# Patient Record
Sex: Male | Born: 1970 | Race: White | Hispanic: No | Marital: Married | Smoking: Heavy tobacco smoker
Health system: Southern US, Community
[De-identification: ages and names within clinical notes are randomized; demographics above are authoritative.]

## PROBLEM LIST (undated history)

## (undated) DIAGNOSIS — J45909 Unspecified asthma, uncomplicated: Secondary | ICD-10-CM

## (undated) HISTORY — PX: HAND SURGERY: SHX662

---

## 2017-01-27 ENCOUNTER — Observation Stay: Payer: Self-pay

## 2017-01-27 ENCOUNTER — Encounter: Payer: Self-pay | Admitting: Emergency Medicine

## 2017-01-27 ENCOUNTER — Observation Stay: Payer: Self-pay | Admitting: Anesthesiology

## 2017-01-27 ENCOUNTER — Emergency Department: Payer: Self-pay

## 2017-01-27 ENCOUNTER — Other Ambulatory Visit: Payer: Self-pay

## 2017-01-27 ENCOUNTER — Observation Stay
Admission: EM | Admit: 2017-01-27 | Discharge: 2017-01-28 | Disposition: A | Discharge status: Home / Self Care | Payer: Self-pay | Attending: Surgery | Admitting: Surgery

## 2017-01-27 ENCOUNTER — Encounter
Admission: EM | Disposition: A | Discharge status: Home / Self Care | Payer: Self-pay | Source: Home / Self Care | Attending: Emergency Medicine

## 2017-01-27 DIAGNOSIS — W010XXA Fall on same level from slipping, tripping and stumbling without subsequent striking against object, initial encounter: Secondary | ICD-10-CM | POA: Insufficient documentation

## 2017-01-27 DIAGNOSIS — M25571 Pain in right ankle and joints of right foot: Secondary | ICD-10-CM | POA: Insufficient documentation

## 2017-01-27 DIAGNOSIS — S82851A Displaced trimalleolar fracture of right lower leg, initial encounter for closed fracture: Secondary | ICD-10-CM

## 2017-01-27 DIAGNOSIS — S82841A Displaced bimalleolar fracture of right lower leg, initial encounter for closed fracture: Principal | ICD-10-CM | POA: Insufficient documentation

## 2017-01-27 DIAGNOSIS — S0181XA Laceration without foreign body of other part of head, initial encounter: Secondary | ICD-10-CM | POA: Insufficient documentation

## 2017-01-27 DIAGNOSIS — Z419 Encounter for procedure for purposes other than remedying health state, unspecified: Secondary | ICD-10-CM

## 2017-01-27 DIAGNOSIS — Y92481 Parking lot as the place of occurrence of the external cause: Secondary | ICD-10-CM | POA: Insufficient documentation

## 2017-01-27 DIAGNOSIS — F17200 Nicotine dependence, unspecified, uncomplicated: Secondary | ICD-10-CM | POA: Insufficient documentation

## 2017-01-27 HISTORY — PX: ORIF ANKLE FRACTURE: SHX5408

## 2017-01-27 LAB — COMPREHENSIVE METABOLIC PANEL
ALBUMIN: 4.1 g/dL (ref 3.5–5.0)
ALT: 43 U/L (ref 17–63)
ANION GAP: 8 (ref 5–15)
AST: 37 U/L (ref 15–41)
Alkaline Phosphatase: 91 U/L (ref 38–126)
BILIRUBIN TOTAL: 0.5 mg/dL (ref 0.3–1.2)
BUN: 14 mg/dL (ref 6–20)
CHLORIDE: 105 mmol/L (ref 101–111)
CO2: 25 mmol/L (ref 22–32)
Calcium: 9.1 mg/dL (ref 8.9–10.3)
Creatinine, Ser: 1.16 mg/dL (ref 0.61–1.24)
GFR calc Af Amer: >60 mL/min (ref >60)
GLUCOSE: 111 mg/dL — AB (ref 65–99)
POTASSIUM: 3.6 mmol/L (ref 3.5–5.1)
Sodium: 138 mmol/L (ref 135–145)
TOTAL PROTEIN: 7.4 g/dL (ref 6.5–8.1)

## 2017-01-27 LAB — CBC
HEMATOCRIT: 44 % (ref 40.0–52.0)
Hemoglobin: 15.3 g/dL (ref 13.0–18.0)
MCH: 33.4 pg (ref 26.0–34.0)
MCHC: 34.8 g/dL (ref 32.0–36.0)
MCV: 95.9 fL (ref 80.0–100.0)
PLATELETS: 254 10*3/uL (ref 150–440)
RBC: 4.59 MIL/uL (ref 4.40–5.90)
RDW: 13.8 % (ref 11.5–14.5)
WBC: 8.8 10*3/uL (ref 3.8–10.6)

## 2017-01-27 LAB — SURGICAL PCR SCREEN
MRSA, PCR: NEGATIVE
STAPHYLOCOCCUS AUREUS: NEGATIVE

## 2017-01-27 LAB — ETHANOL: ALCOHOL ETHYL (B): 257 mg/dL — AB (ref <10)

## 2017-01-27 SURGERY — OPEN REDUCTION INTERNAL FIXATION (ORIF) ANKLE FRACTURE
Anesthesia: General | Site: Ankle | Laterality: Right | Wound class: Clean

## 2017-01-27 MED ORDER — DEXTROSE 5 % IV SOLN
2.0000 g | Freq: Four times a day (QID) | INTRAVENOUS | Status: AC
  Administered 2017-01-27 – 2017-01-28 (×3): 2 g via INTRAVENOUS
  Filled 2017-01-27 (×3): qty 2000

## 2017-01-27 MED ORDER — FENTANYL CITRATE (PF) 100 MCG/2ML IJ SOLN
INTRAMUSCULAR | Status: AC
  Administered 2017-01-27: 50 ug via INTRAVENOUS
  Filled 2017-01-27: qty 2

## 2017-01-27 MED ORDER — ONDANSETRON HCL 4 MG/2ML IJ SOLN: 4.0000 mg | Freq: Four times a day (QID) | INTRAMUSCULAR | Status: DC | PRN

## 2017-01-27 MED ORDER — MAGNESIUM HYDROXIDE 400 MG/5ML PO SUSP: 30.0000 mL | Freq: Every day | ORAL | Status: DC | PRN

## 2017-01-27 MED ORDER — MORPHINE SULFATE (PF) 4 MG/ML IV SOLN
4.0000 mg | Freq: Once | INTRAVENOUS | Status: AC
  Administered 2017-01-27: 4 mg via INTRAVENOUS
  Filled 2017-01-27: qty 1

## 2017-01-27 MED ORDER — PANTOPRAZOLE SODIUM 40 MG PO TBEC
40.0000 mg | DELAYED_RELEASE_TABLET | Freq: Every day | ORAL | Status: DC
  Administered 2017-01-28: 40 mg via ORAL
  Filled 2017-01-27: qty 1

## 2017-01-27 MED ORDER — DEXAMETHASONE SODIUM PHOSPHATE 10 MG/ML IJ SOLN
INTRAMUSCULAR | Status: AC
Start: 2017-01-27 — End: ?
  Filled 2017-01-27: qty 1

## 2017-01-27 MED ORDER — PHENYLEPHRINE HCL 10 MG/ML IJ SOLN
INTRAMUSCULAR | Status: AC
  Filled 2017-01-27: qty 1

## 2017-01-27 MED ORDER — SUCCINYLCHOLINE CHLORIDE 20 MG/ML IJ SOLN
INTRAMUSCULAR | Status: AC
  Filled 2017-01-27: qty 1

## 2017-01-27 MED ORDER — BISACODYL 10 MG RE SUPP: 10.0000 mg | Freq: Every day | RECTAL | Status: DC | PRN

## 2017-01-27 MED ORDER — NEOMYCIN-POLYMYXIN B GU 40-200000 IR SOLN
Status: DC | PRN
  Administered 2017-01-27: 2 mL

## 2017-01-27 MED ORDER — ACETAMINOPHEN 650 MG RE SUPP: 650.0000 mg | Freq: Four times a day (QID) | RECTAL | Status: DC | PRN

## 2017-01-27 MED ORDER — ACETAMINOPHEN 650 MG RE SUPP: 650.0000 mg | RECTAL | Status: DC | PRN

## 2017-01-27 MED ORDER — LIDOCAINE HCL (CARDIAC) 20 MG/ML IV SOLN
INTRAVENOUS | Status: DC | PRN
  Administered 2017-01-27: 100 mg via INTRAVENOUS

## 2017-01-27 MED ORDER — ACETAMINOPHEN 10 MG/ML IV SOLN
INTRAVENOUS | Status: DC | PRN
  Administered 2017-01-27: 1000 mg via INTRAVENOUS

## 2017-01-27 MED ORDER — MORPHINE SULFATE (PF) 2 MG/ML IV SOLN
2.0000 mg | Freq: Once | INTRAVENOUS | Status: AC
  Administered 2017-01-27: 2 mg via INTRAVENOUS
  Filled 2017-01-27: qty 1

## 2017-01-27 MED ORDER — ACETAMINOPHEN 500 MG PO TABS
1000.0000 mg | ORAL_TABLET | Freq: Four times a day (QID) | ORAL | Status: AC
  Administered 2017-01-27 – 2017-01-28 (×4): 1000 mg via ORAL
  Filled 2017-01-27 (×4): qty 2

## 2017-01-27 MED ORDER — MIDAZOLAM HCL 2 MG/2ML IJ SOLN
INTRAMUSCULAR | Status: DC | PRN
  Administered 2017-01-27 (×2): 2 mg via INTRAVENOUS

## 2017-01-27 MED ORDER — DEXAMETHASONE SODIUM PHOSPHATE 10 MG/ML IJ SOLN
INTRAMUSCULAR | Status: DC | PRN
  Administered 2017-01-27: 5 mg via INTRAVENOUS

## 2017-01-27 MED ORDER — DEXTROSE 5 % IV SOLN
2.0000 g | Freq: Once | INTRAVENOUS | Status: AC
  Administered 2017-01-27: 2 g via INTRAVENOUS
  Filled 2017-01-27: qty 2000

## 2017-01-27 MED ORDER — ONDANSETRON HCL 4 MG/2ML IJ SOLN
INTRAMUSCULAR | Status: AC
  Filled 2017-01-27: qty 2

## 2017-01-27 MED ORDER — PROMETHAZINE HCL 25 MG/ML IJ SOLN: 6.2500 mg | INTRAMUSCULAR | Status: DC | PRN

## 2017-01-27 MED ORDER — KETOROLAC TROMETHAMINE 15 MG/ML IJ SOLN
30.0000 mg | Freq: Once | INTRAMUSCULAR | Status: DC
  Filled 2017-01-27: qty 2

## 2017-01-27 MED ORDER — OXYCODONE HCL 5 MG PO TABS: 5.0000 mg | ORAL_TABLET | ORAL | Status: DC | PRN

## 2017-01-27 MED ORDER — OXYCODONE HCL 5 MG PO TABS: 5.0000 mg | ORAL_TABLET | Freq: Once | ORAL | Status: DC | PRN

## 2017-01-27 MED ORDER — FENTANYL CITRATE (PF) 100 MCG/2ML IJ SOLN: 25.0000 ug | INTRAMUSCULAR | Status: DC | PRN

## 2017-01-27 MED ORDER — OXYCODONE HCL 5 MG PO TABS
10.0000 mg | ORAL_TABLET | ORAL | Status: DC | PRN
  Administered 2017-01-27 – 2017-01-28 (×2): 10 mg via ORAL
  Filled 2017-01-27 (×2): qty 2

## 2017-01-27 MED ORDER — KCL IN DEXTROSE-NACL 20-5-0.9 MEQ/L-%-% IV SOLN
INTRAVENOUS | Status: DC
  Administered 2017-01-27: 08:00:00 via INTRAVENOUS
  Filled 2017-01-27 (×3): qty 1000

## 2017-01-27 MED ORDER — ACETAMINOPHEN 10 MG/ML IV SOLN
INTRAVENOUS | Status: AC
  Filled 2017-01-27: qty 100

## 2017-01-27 MED ORDER — LIDOCAINE HCL (PF) 1 % IJ SOLN
INTRAMUSCULAR | Status: DC | PRN
  Administered 2017-01-27: 3 mL

## 2017-01-27 MED ORDER — MIDAZOLAM HCL 2 MG/2ML IJ SOLN
INTRAMUSCULAR | Status: AC
  Administered 2017-01-27: 2 mg via INTRAVENOUS
  Filled 2017-01-27: qty 2

## 2017-01-27 MED ORDER — ACETAMINOPHEN 325 MG PO TABS: 650.0000 mg | ORAL_TABLET | ORAL | Status: DC | PRN

## 2017-01-27 MED ORDER — CEFAZOLIN SODIUM 1 G IJ SOLR
INTRAMUSCULAR | Status: AC
  Filled 2017-01-27: qty 20

## 2017-01-27 MED ORDER — METOCLOPRAMIDE HCL 5 MG/ML IJ SOLN: 5.0000 mg | Freq: Three times a day (TID) | INTRAMUSCULAR | Status: DC | PRN

## 2017-01-27 MED ORDER — CEFAZOLIN SODIUM-DEXTROSE 2-3 GM-%(50ML) IV SOLR
INTRAVENOUS | Status: DC | PRN
  Administered 2017-01-27: 2 g via INTRAVENOUS

## 2017-01-27 MED ORDER — MIDAZOLAM HCL 2 MG/2ML IJ SOLN
2.0000 mg | Freq: Once | INTRAMUSCULAR | Status: AC
  Administered 2017-01-27: 2 mg via INTRAVENOUS

## 2017-01-27 MED ORDER — FENTANYL CITRATE (PF) 100 MCG/2ML IJ SOLN
50.0000 ug | Freq: Once | INTRAMUSCULAR | Status: AC
  Administered 2017-01-27: 50 ug via INTRAVENOUS

## 2017-01-27 MED ORDER — PROPOFOL 10 MG/ML IV BOLUS
INTRAVENOUS | Status: DC | PRN
  Administered 2017-01-27: 140 mg via INTRAVENOUS

## 2017-01-27 MED ORDER — MIDAZOLAM HCL 2 MG/2ML IJ SOLN
INTRAMUSCULAR | Status: AC
  Filled 2017-01-27: qty 2

## 2017-01-27 MED ORDER — LIDOCAINE HCL (PF) 1 % IJ SOLN
INTRAMUSCULAR | Status: AC
  Filled 2017-01-27: qty 5

## 2017-01-27 MED ORDER — DIPHENHYDRAMINE HCL 12.5 MG/5ML PO ELIX: 12.5000 mg | ORAL_SOLUTION | ORAL | Status: DC | PRN

## 2017-01-27 MED ORDER — NICOTINE 21 MG/24HR TD PT24
21.0000 mg | MEDICATED_PATCH | Freq: Every day | TRANSDERMAL | Status: DC
  Administered 2017-01-27: 21 mg via TRANSDERMAL
  Filled 2017-01-27: qty 1

## 2017-01-27 MED ORDER — KETOROLAC TROMETHAMINE 30 MG/ML IJ SOLN
INTRAMUSCULAR | Status: DC | PRN
  Administered 2017-01-27: 30 mg via INTRAVENOUS

## 2017-01-27 MED ORDER — ROPIVACAINE HCL 5 MG/ML IJ SOLN
INTRAMUSCULAR | Status: DC | PRN
  Administered 2017-01-27: 10 mL
  Administered 2017-01-27: 30 mL via PERINEURAL

## 2017-01-27 MED ORDER — ENOXAPARIN SODIUM 40 MG/0.4ML ~~LOC~~ SOLN: 40.0000 mg | SUBCUTANEOUS | Status: DC

## 2017-01-27 MED ORDER — ONDANSETRON HCL 4 MG PO TABS: 4.0000 mg | ORAL_TABLET | Freq: Four times a day (QID) | ORAL | Status: DC | PRN

## 2017-01-27 MED ORDER — HYDROMORPHONE HCL 1 MG/ML IJ SOLN
INTRAMUSCULAR | Status: DC | PRN
  Administered 2017-01-27 (×2): 0.5 mg via INTRAVENOUS

## 2017-01-27 MED ORDER — FLEET ENEMA 7-19 GM/118ML RE ENEM: 1.0000 | ENEMA | Freq: Once | RECTAL | Status: DC | PRN

## 2017-01-27 MED ORDER — FENTANYL CITRATE (PF) 100 MCG/2ML IJ SOLN
INTRAMUSCULAR | Status: DC | PRN
  Administered 2017-01-27 (×2): 50 ug via INTRAVENOUS

## 2017-01-27 MED ORDER — LIDOCAINE HCL (PF) 1 % IJ SOLN
INTRAMUSCULAR | Status: AC
  Administered 2017-01-27: 5 mL
  Filled 2017-01-27: qty 5

## 2017-01-27 MED ORDER — PANTOPRAZOLE SODIUM 40 MG IV SOLR: 40.0000 mg | Freq: Every day | INTRAVENOUS | Status: DC

## 2017-01-27 MED ORDER — ONDANSETRON HCL 4 MG/2ML IJ SOLN
INTRAMUSCULAR | Status: DC | PRN
  Administered 2017-01-27: 4 mg via INTRAVENOUS

## 2017-01-27 MED ORDER — ONDANSETRON 4 MG PO TBDP
4.0000 mg | ORAL_TABLET | Freq: Four times a day (QID) | ORAL | Status: DC | PRN
  Filled 2017-01-27: qty 1

## 2017-01-27 MED ORDER — PROPOFOL 10 MG/ML IV BOLUS
INTRAVENOUS | Status: AC
  Filled 2017-01-27: qty 40

## 2017-01-27 MED ORDER — SUCCINYLCHOLINE CHLORIDE 20 MG/ML IJ SOLN
INTRAMUSCULAR | Status: DC | PRN
  Administered 2017-01-27: 80 mg via INTRAVENOUS

## 2017-01-27 MED ORDER — HYDROMORPHONE HCL 1 MG/ML IJ SOLN
1.0000 mg | Freq: Once | INTRAMUSCULAR | Status: AC
  Administered 2017-01-27: 1 mg via INTRAVENOUS
  Filled 2017-01-27: qty 1

## 2017-01-27 MED ORDER — METOCLOPRAMIDE HCL 10 MG PO TABS: 5.0000 mg | ORAL_TABLET | Freq: Three times a day (TID) | ORAL | Status: DC | PRN

## 2017-01-27 MED ORDER — DOCUSATE SODIUM 100 MG PO CAPS
100.0000 mg | ORAL_CAPSULE | Freq: Two times a day (BID) | ORAL | Status: DC
  Administered 2017-01-27 – 2017-01-28 (×2): 100 mg via ORAL
  Filled 2017-01-27 (×2): qty 1

## 2017-01-27 MED ORDER — OXYCODONE HCL 5 MG/5ML PO SOLN: 5.0000 mg | Freq: Once | ORAL | Status: DC | PRN

## 2017-01-27 MED ORDER — KCL IN DEXTROSE-NACL 20-5-0.9 MEQ/L-%-% IV SOLN
INTRAVENOUS | Status: DC
  Administered 2017-01-27 (×2): via INTRAVENOUS
  Filled 2017-01-27 (×4): qty 1000

## 2017-01-27 MED ORDER — MORPHINE SULFATE (PF) 2 MG/ML IV SOLN
2.0000 mg | INTRAVENOUS | Status: DC | PRN
  Administered 2017-01-28: 4 mg via INTRAVENOUS
  Filled 2017-01-27: qty 2

## 2017-01-27 MED ORDER — CEFAZOLIN SODIUM-DEXTROSE 2-4 GM/100ML-% IV SOLN
2.0000 g | Freq: Once | INTRAVENOUS | Status: DC
Start: 2017-01-27 — End: 2017-01-27

## 2017-01-27 MED ORDER — KETOROLAC TROMETHAMINE 15 MG/ML IJ SOLN
15.0000 mg | Freq: Four times a day (QID) | INTRAMUSCULAR | Status: AC
  Administered 2017-01-27 – 2017-01-28 (×4): 15 mg via INTRAVENOUS
  Filled 2017-01-27 (×4): qty 1

## 2017-01-27 MED ORDER — DOCUSATE SODIUM 100 MG PO CAPS: 100.0000 mg | ORAL_CAPSULE | Freq: Two times a day (BID) | ORAL | Status: DC

## 2017-01-27 MED ORDER — FENTANYL CITRATE (PF) 100 MCG/2ML IJ SOLN
INTRAMUSCULAR | Status: AC
  Filled 2017-01-27: qty 2

## 2017-01-27 MED ORDER — MEPERIDINE HCL 50 MG/ML IJ SOLN: 6.2500 mg | INTRAMUSCULAR | Status: DC | PRN

## 2017-01-27 MED ORDER — MORPHINE SULFATE (PF) 2 MG/ML IV SOLN
2.0000 mg | INTRAVENOUS | Status: DC | PRN
  Administered 2017-01-27 (×2): 4 mg via INTRAVENOUS
  Filled 2017-01-27 (×2): qty 2

## 2017-01-27 MED ORDER — ROPIVACAINE HCL 5 MG/ML IJ SOLN
INTRAMUSCULAR | Status: AC
  Filled 2017-01-27: qty 60

## 2017-01-27 MED ORDER — ACETAMINOPHEN 325 MG PO TABS: 650.0000 mg | ORAL_TABLET | Freq: Four times a day (QID) | ORAL | Status: DC | PRN

## 2017-01-27 MED ORDER — HYDROMORPHONE HCL 1 MG/ML IJ SOLN
INTRAMUSCULAR | Status: AC
  Filled 2017-01-27: qty 1

## 2017-01-27 MED ORDER — LACTATED RINGERS IV SOLN
INTRAVENOUS | Status: DC | PRN
  Administered 2017-01-27: 12:00:00 via INTRAVENOUS

## 2017-01-27 SURGICAL SUPPLY — 70 items
BANDAGE ACE 4X5 VEL STRL LF (GAUZE/BANDAGES/DRESSINGS) ×9 IMPLANT
BANDAGE ACE 6X5 VEL STRL LF (GAUZE/BANDAGES/DRESSINGS) ×6 IMPLANT
BIOMET ZIP TIGHT FIXATION SYSTEM ×3 IMPLANT
BIT DRILL 2.5X2.75 QC CALB (BIT) ×3 IMPLANT
BIT DRILL 2.9 CANN QC NONSTRL (BIT) ×3 IMPLANT
BIT DRILL 3.5X5.5 QC CALB (BIT) ×3 IMPLANT
BIT DRILL CALIBRATED 2.7 (BIT) ×2 IMPLANT
BIT DRILL CALIBRATED 2.7MM (BIT) ×1
BLADE CLIPPER SURG (BLADE) ×3 IMPLANT
BLADE SURG SZ10 CARB STEEL (BLADE) ×6 IMPLANT
BNDG COHESIVE 4X5 TAN STRL (GAUZE/BANDAGES/DRESSINGS) ×3 IMPLANT
BNDG ESMARK 6X12 TAN STRL LF (GAUZE/BANDAGES/DRESSINGS) ×3 IMPLANT
BNDG PLASTER FAST 4X5 WHT LF (CAST SUPPLIES) IMPLANT
CANISTER SUCT 1200ML W/VALVE (MISCELLANEOUS) ×3 IMPLANT
CHLORAPREP W/TINT 26ML (MISCELLANEOUS) ×6 IMPLANT
COVER LIGHT HANDLE STERIS (MISCELLANEOUS) ×6 IMPLANT
CUFF TOURN 24 STER (MISCELLANEOUS) ×3 IMPLANT
CUFF TOURN 30 STER DUAL PORT (MISCELLANEOUS) IMPLANT
DRAPE C-ARM XRAY 36X54 (DRAPES) ×3 IMPLANT
DRAPE C-ARMOR (DRAPES) ×3 IMPLANT
DRAPE INCISE IOBAN 66X45 STRL (DRAPES) ×3 IMPLANT
DRAPE U-SHAPE 47X51 STRL (DRAPES) ×3 IMPLANT
ELECT CAUTERY BLADE 6.4 (BLADE) ×3 IMPLANT
ELECT REM PT RETURN 9FT ADLT (ELECTROSURGICAL) ×3
ELECTRODE REM PT RTRN 9FT ADLT (ELECTROSURGICAL) ×1 IMPLANT
FIXATION ZIPTIGHT ANKLE SNDSMS (Ankle) ×1 IMPLANT
GAUZE PETRO XEROFOAM 1X8 (MISCELLANEOUS) ×3 IMPLANT
GAUZE SPONGE 4X4 12PLY STRL (GAUZE/BANDAGES/DRESSINGS) ×3 IMPLANT
GLOVE BIO SURGEON STRL SZ8 (GLOVE) ×6 IMPLANT
GLOVE INDICATOR 8.0 STRL GRN (GLOVE) ×3 IMPLANT
GOWN STRL REUS W/ TWL LRG LVL3 (GOWN DISPOSABLE) ×1 IMPLANT
GOWN STRL REUS W/ TWL XL LVL3 (GOWN DISPOSABLE) ×1 IMPLANT
GOWN STRL REUS W/TWL LRG LVL3 (GOWN DISPOSABLE) ×2
GOWN STRL REUS W/TWL XL LVL3 (GOWN DISPOSABLE) ×2
HEMOVAC 400ML (MISCELLANEOUS)
K-WIRE ACE 1.6X6 (WIRE) ×6
KIT DRAIN HEMOVAC JP 7FR 400ML (MISCELLANEOUS) IMPLANT
KIT RM TURNOVER STRD PROC AR (KITS) ×3 IMPLANT
KWIRE ACE 1.6X6 (WIRE) ×2 IMPLANT
LABEL OR SOLS (LABEL) ×3 IMPLANT
NS IRRIG 1000ML POUR BTL (IV SOLUTION) ×3 IMPLANT
PACK EXTREMITY ARMC (MISCELLANEOUS) ×3 IMPLANT
PAD ABD DERMACEA PRESS 5X9 (GAUZE/BANDAGES/DRESSINGS) ×6 IMPLANT
PAD CAST CTTN 4X4 STRL (SOFTGOODS) ×2 IMPLANT
PAD PREP 24X41 OB/GYN DISP (PERSONAL CARE ITEMS) ×3 IMPLANT
PADDING CAST 4IN STRL (MISCELLANEOUS) ×4
PADDING CAST BLEND 4X4 STRL (MISCELLANEOUS) ×2 IMPLANT
PADDING CAST COTTON 4X4 STRL (SOFTGOODS) ×4
PLATE LOCK 8H 103 BILAT FIB (Plate) ×3 IMPLANT
SCREW ACE CAN 4.0 40M (Screw) ×6 IMPLANT
SCREW CORTICAL 3.5MM  16MM (Screw) ×2 IMPLANT
SCREW CORTICAL 3.5MM 16MM (Screw) ×1 IMPLANT
SCREW CORTICAL 3.5MM 18MM (Screw) ×3 IMPLANT
SCREW LOCK CORT STAR 3.5X12 (Screw) ×6 IMPLANT
SCREW LOCK CORT STAR 3.5X14 (Screw) ×3 IMPLANT
SCREW NON LOCKING LP 3.5 14MM (Screw) ×6 IMPLANT
SCREW NON LOCKING LP 3.5 16MM (Screw) ×3 IMPLANT
SPLINT CAST 1 STEP 5X30 WHT (MISCELLANEOUS) ×3 IMPLANT
SPLINT FAST PLASTER 5X30 (CAST SUPPLIES) ×2
SPLINT PLASTER CAST FAST 5X30 (CAST SUPPLIES) ×1 IMPLANT
SPONGE LAP 18X18 5 PK (GAUZE/BANDAGES/DRESSINGS) ×3 IMPLANT
STAPLER SKIN PROX 35W (STAPLE) ×3 IMPLANT
STOCKINETTE IMPERV 14X48 (MISCELLANEOUS) ×3 IMPLANT
SUT VIC AB 0 CT1 36 (SUTURE) ×3 IMPLANT
SUT VIC AB 2-0 CT1 27 (SUTURE) ×2
SUT VIC AB 2-0 CT1 TAPERPNT 27 (SUTURE) ×1 IMPLANT
SUT VIC AB 2-0 SH 27 (SUTURE) ×8
SUT VIC AB 2-0 SH 27XBRD (SUTURE) ×4 IMPLANT
SYR 10ML LL (SYRINGE) ×3 IMPLANT
ZIPTIGHT ANKLE SYNODESMOSS FIX (Ankle) ×3 IMPLANT
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
## 2017-01-27 NOTE — Progress Notes (Signed)
Pt arrived to room from PACU. Pt denies any pain at this time. IV infusing. Pt on room air. Call bell within reach. Clear liquid diet started. Call bell and phone within reach.
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
## 2017-01-27 NOTE — Anesthesia Preprocedure Evaluation (Signed)
Anesthesia Evaluation  Patient identified by MRN, date of birth, ID band Patient awake    Reviewed: Allergy & Precautions, NPO status , Patient's Chart, lab work & pertinent test results  History of Anesthesia Complications Negative for: history of anesthetic complications  Airway Mallampati: II  TM Distance: >3 FB Neck ROM: Full    Dental no notable dental hx.    Pulmonary asthma (mild intermittent) , neg sleep apnea, neg COPD, Current Smoker,    breath sounds clear to auscultation- rhonchi (-) wheezing      Cardiovascular Exercise Tolerance: Good (-) hypertension(-) CAD, (-) Past MI and (-) Cardiac Stents  Rhythm:Regular Rate:Normal - Systolic murmurs and - Diastolic murmurs    Neuro/Psych negative neurological ROS  negative psych ROS   GI/Hepatic negative GI ROS, Neg liver ROS,   Endo/Other  negative endocrine ROSneg diabetes  Renal/GU negative Renal ROS     Musculoskeletal negative musculoskeletal ROS (+)   Abdominal (+) - obese,   Peds  Hematology negative hematology ROS (+)   Anesthesia Other Findings History reviewed. No pertinent past medical history.   Reproductive/Obstetrics                             Anesthesia Physical Anesthesia Plan  ASA: II  Anesthesia Plan: General   Post-op Pain Management:  Regional for Post-op pain   Induction: Intravenous  PONV Risk Score and Plan: 0 and Ondansetron  Airway Management Planned: Oral ETT  Additional Equipment:   Intra-op Plan:   Post-operative Plan: Extubation in OR  Informed Consent: I have reviewed the patients History and Physical, chart, labs and discussed the procedure including the risks, benefits and alternatives for the proposed anesthesia with the patient or authorized representative who has indicated his/her understanding and acceptance.   Dental advisory given  Plan Discussed with: CRNA and  Anesthesiologist  Anesthesia Plan Comments:         Anesthesia Quick Evaluation

## 2017-01-27 NOTE — Anesthesia Post-op Follow-up Note (Signed)
Anesthesia QCDR form completed.        
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
## 2017-01-27 NOTE — Anesthesia Procedure Notes (Addendum)
Anesthesia Regional Block: Popliteal block   Pre-Anesthetic Checklist: ,, timeout performed, Correct Patient, Correct Site, Correct Laterality, Correct Procedure, Correct Position, site marked, Risks and benefits discussed,  Surgical consent,  Pre-op evaluation,  At surgeon's request and post-op pain management  Laterality: Right  Prep: chloraprep       Needles:  Injection technique: Single-shot  Needle Type: Stimiplex     Needle Length: 10cm  Needle Gauge: 20     Additional Needles:   Procedures:,,,, ultrasound used (permanent image in chart),,,,  Narrative:  Start time: 01/27/2017 11:35 AM End time: 01/27/2017 11:45 AM Injection made incrementally with aspirations every 5 mL.  Performed by: Personally  Anesthesiologist: Alver FisherPenwarden, Derrill Bagnell, MD  Additional Notes: Functioning IV was confirmed and monitors were applied.  A Stimuplex needle was used. Sterile prep and drape,hand hygiene and sterile gloves were used.  Negative aspiration and negative test dose prior to incremental administration of local anesthetic. The patient tolerated the procedure well.  Supplemental saphenous block was also performed
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
## 2017-01-27 NOTE — Progress Notes (Signed)
Pt alert and oriented. Medicated for pain x1 with good results. Voiding in the urinal without difficulty. Eating and drinking without difficulty. Iv infusing without difficulty. [facility] she will resume care of pt this shift.

## 2017-01-27 NOTE — Anesthesia Procedure Notes (Signed)
Procedure Name: Intubation Date/Time: 01/27/2017 12:44 PM Performed by: Emmie Niemann, MD Pre-anesthesia Checklist: Patient identified, Patient being monitored, Timeout performed, Emergency Drugs available and Suction available Patient Re-evaluated:Patient Re-evaluated prior to induction Oxygen Delivery Method: Circle system utilized Preoxygenation: Pre-oxygenation with 100% oxygen Induction Type: IV induction Ventilation: Mask ventilation without difficulty Laryngoscope Size: Glidescope and 4 Grade View: Grade I Tube type: Oral Tube size: 7.5 mm Number of attempts: 2 (Difficulty passing ETT w/DL MAC 4; Chose Glidescope) Airway Equipment and Method: Stylet and Video-laryngoscopy Placement Confirmation: ETT inserted through vocal cords under direct vision,  positive ETCO2 and breath sounds checked- equal and bilateral Secured at: 22 cm Tube secured with: Tape Dental Injury: Teeth and Oropharynx as per pre-operative assessment

## 2017-01-27 NOTE — Transfer of Care (Signed)
Immediate Anesthesia Transfer of Care Note  Patient: Ronald Logan  Procedure(s) Performed: OPEN REDUCTION INTERNAL FIXATION (ORIF) ANKLE FRACTURE (Right Anus)  Patient Location: PACU  Anesthesia Type:General  Level of Consciousness: sedated and patient cooperative  Airway & Oxygen Therapy: Patient Spontanous Breathing and Patient connected to face mask oxygen  Post-op Assessment: Report given to RN, Post -op Vital signs reviewed and stable and Patient moving all extremities  Post vital signs: Reviewed and stable  Last Vitals:  Vitals:   01/27/17 1222 01/27/17 1508  BP:    Pulse: 86   Resp: 14   Temp:  (P) 36.7 C  SpO2: 98%     Last Pain:  Vitals:   01/27/17 1147  TempSrc:   PainSc: Asleep      Patients Stated Pain Goal: 1 (01/27/17 0903)  Complications: No apparent anesthesia complications
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
## 2017-01-27 NOTE — Anesthesia Postprocedure Evaluation (Signed)
Anesthesia Post Note  Patient: Ronald BuntingMichael Allen Logan  Procedure(s) Performed: OPEN REDUCTION INTERNAL FIXATION (ORIF) ANKLE FRACTURE (Right Ankle)  Anesthesia Type: General     Last Vitals:  Vitals:   01/27/17 1523 01/27/17 1538  BP: 129/90 (!) 132/95  Pulse: 89 88  Resp: (!) 8 (!) 7  Temp:    SpO2: 99% 99%    Last Pain:  Vitals:   01/27/17 1523  TempSrc:   PainSc: Asleep                 Rhyen Mazariego
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
## 2017-01-28 ENCOUNTER — Encounter: Payer: Self-pay | Admitting: Surgery

## 2017-01-28 MED ORDER — OXYCODONE HCL 5 MG PO TABS: 5.0000 mg | ORAL_TABLET | ORAL | 0 refills | Status: AC | PRN
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
## 2017-01-28 NOTE — Care Management (Signed)
[facility] Card given for prescriptions. [facility](MATCH) program
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
## 2017-01-28 NOTE — Progress Notes (Signed)
Discharge note;  Discharge instructions and prescription given to pt. IV removed. Pt getting dressed. Ready to be discharged.

## 2017-01-28 NOTE — Discharge Instructions (Signed)
Diet: As you were doing prior to hospitalization   Shower:  May shower but keep the wounds dry, use an occlusive plastic wrap, NO SOAKING IN TUB.  If the bandage gets wet, change with a clean dry gauze.  Dressing:  You may change your dressing as needed. Change the dressing with sterile gauze dressing.    Activity:  Increase activity slowly as tolerated, but follow the weight bearing instructions below.  No lifting or driving for 6 weeks.  Weight Bearing:   No weightbearing to right lower extremity  Blood Clot Prevention: Take one 325mg  aspirin daily.  To prevent constipation: you may use a stool softener such as -  Colace (over the counter) 100 mg by mouth twice a day  Drink plenty of fluids (prune juice may be helpful) and high fiber foods Miralax (over the counter) for constipation as needed.    Itching:  If you experience itching with your medications, try taking only a single pain pill, or even half a pain pill at a time.  You may take up to 10 pain pills per day, and you can also use benadryl over the counter for itching or also to help with sleep.   Precautions:  If you experience chest pain or shortness of breath - call 911 immediately for transfer to the hospital emergency department!!  If you develop a fever greater that 101 F, purulent drainage from wound, increased redness or drainage from wound, or calf pain-Call [facility]                                              Follow- Up Appointment:  Please call for an appointment [facility]

## 2017-01-28 NOTE — Evaluation (Signed)
Physical Therapy Evaluation Patient Details Name: Ronald Logan MRN: [id number] DOB: 08/01/1970 Today's Date: 01/28/2017   History of Present Illness  Pt is a 47 y/o M who presented s/p fall and R bimalleolar ankle fx.  Pt now s/p ORIF R ankle.  Clinical Impression  Patient is s/p above surgery resulting in functional limitations due to the deficits listed below (see PT Problem List). 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  This PT provided supervision for transfers and ambulation for safety.  Pt demonstrates safe technique using RW with all aspects of mobility and was able to maintain NWB RLE without cues. Provided information via handout and explanation on [facility] once MD clears pt for PT. Pt will have 24/7 assist/supervision available from fiance and fiance's mother at d/c.  No steps to enter first floor apartment. Patient will benefit from skilled PT to increase their independence and safety with mobility to allow discharge to the venue listed below.      Follow Up Recommendations Outpatient PT([facility] once pt cleared by MD for PT)    Equipment Recommendations  Rolling walker with 5" wheels    Recommendations for Other Services       Precautions / Restrictions Precautions Precautions: Fall Restrictions Weight Bearing Restrictions: Yes RLE Weight Bearing: Non weight bearing      Mobility  Bed Mobility Overal bed mobility: Independent             General bed mobility comments: No physical assist or cues needed.  Pt performs independently.   Transfers Overall transfer level: Needs assistance Equipment used: Rolling walker (2 wheeled) Transfers: Sit to/from Stand Sit to Stand: Supervision         General transfer comment: Supervision for safety and cues for proper technique using RW.  No sign of instability.   Ambulation/Gait Ambulation/Gait assistance: Supervision Ambulation Distance (Feet): 80 Feet Assistive device:  Rolling walker (2 wheeled) Gait Pattern/deviations: (hop on LLE) Gait velocity: decreased Gait velocity interpretation: Below normal speed for age/gender General Gait Details: Pt with proper management of RW and adheres to NWB RLE.  Supervision provided for safety.   Stairs            Wheelchair Mobility    Modified Rankin (Stroke Patients Only)       Balance Overall balance assessment: Needs assistance Sitting-balance support: No upper extremity supported;Feet supported Sitting balance-Leahy Scale: Good     Standing balance support: Bilateral upper extremity supported;During functional activity Standing balance-Leahy Scale: Poor Standing balance comment: Pt relies on UE support to maintain balance with static and dynamic activities                             Pertinent Vitals/Pain Pain Assessment: Faces Faces Pain Scale: Hurts little more Pain Location: R ankle Pain Descriptors / Indicators: Aching;Discomfort Pain Intervention(s): Limited activity within patient's tolerance;Monitored during session;Repositioned;Premedicated before session    Home Living Family/patient expects to be discharged to:: Private residence Living Arrangements: Spouse/significant other;Non-relatives/Friends(Fiance and fiance's mother) Available Help at Discharge: Friend(s);Available 24 hours/day Type of Home: Apartment Home Access: Level entry     Home Layout: One level Home Equipment: Grab bars - tub/shower;Shower seat      Prior Function Level of Independence: Independent         Comments: 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  Pt able to perform SLR RLE RLE:  Unable to fully assess due to immobilization    Cervical / Trunk Assessment Cervical / Trunk Assessment: Normal  Communication   Communication: No difficulties  Cognition Arousal/Alertness: Awake/alert Behavior During Therapy: WFL for tasks assessed/performed Overall Cognitive Status: Within Functional Limits for tasks assessed                                        General Comments General comments (skin integrity, edema, etc.): Provided information via handout and explanation on [facility] once MD clears pt for PT.      Exercises General Exercises - Lower Extremity Long Arc Quad: Strengthening;Right;10 reps;Seated Straight Leg Raises: Strengthening;Right;10 reps;Supine   Assessment/Plan    PT Assessment Patient needs continued PT services  PT Problem List Decreased strength;Decreased range of motion;Decreased activity tolerance;Decreased balance;Decreased knowledge of use of DME;Decreased safety awareness;Pain       PT Treatment Interventions DME instruction;Gait training;Stair training;Therapeutic activities;Functional mobility training;Therapeutic exercise;Balance training;Neuromuscular re-education;Patient/family education;Wheelchair mobility training;Modalities    PT Goals (Current goals can be found in the Care Plan section)  Acute Rehab PT Goals Patient Stated Goal: to return to PLOF as soon as possible to return to work PT Goal Formulation: With patient Time For Goal Achievement: 02/11/17 Potential to Achieve Goals: Good    Frequency BID   Barriers to discharge        Co-evaluation               AM-PAC PT "6 Clicks" Daily Activity  Outcome Measure Difficulty turning over in bed (including adjusting bedclothes, sheets and blankets)?: None Difficulty moving from lying on back to sitting on the side of the bed? : None Difficulty sitting down on and standing up from a chair with arms (e.g., wheelchair, bedside commode, etc,.)?: A  Little Help needed moving to and from a bed to chair (including a wheelchair)?: A Little Help needed walking in hospital room?: A Little Help needed climbing 3-5 steps with a railing? : A Little 6 Click Score: 20    End of Session Equipment Utilized During Treatment: Gait belt Activity Tolerance: Patient tolerated treatment well Patient left: in chair;with call bell/phone within reach;with chair alarm set Nurse Communication: Mobility status PT Visit Diagnosis: Pain;Unsteadiness on feet (R26.81);Other abnormalities of gait and mobility (R26.89) Pain - Right/Left: Right Pain - part of body: Ankle and joints of foot    Time: 0921-0942 PT Time Calculation (min) (ACUTE ONLY): 21 min   Charges:   PT Evaluation $PT Eval Low Complexity: 1 Low     PT G Codes:   PT G-Codes **NOT FOR INPATIENT CLASS** Functional Assessment Tool Used: AM-PAC 6 Clicks Basic Mobility;Clinical judgement Functional Limitation: Mobility: Walking and moving around Mobility: Walking and Moving Around Current Status (F6213(G8978): At least 20 percent but less than 40 percent impaired, limited or restricted Mobility: Walking and Moving Around Goal Status [id number](G8979): At least 1 percent but less than 20 percent impaired, limited or restricted    Encarnacion ChuAshley Abashian PT, DPT 01/28/2017, 10:04 AM

## 2017-03-01 ENCOUNTER — Emergency Department: Payer: Self-pay

## 2017-03-01 ENCOUNTER — Emergency Department
Admission: EM | Admit: 2017-03-01 | Discharge: 2017-03-02 | Disposition: A | Discharge status: Home / Self Care | Payer: Self-pay | Attending: Emergency Medicine | Admitting: Emergency Medicine

## 2017-03-01 DIAGNOSIS — S0990XA Unspecified injury of head, initial encounter: Secondary | ICD-10-CM | POA: Insufficient documentation

## 2017-03-01 DIAGNOSIS — Y9289 Other specified places as the place of occurrence of the external cause: Secondary | ICD-10-CM | POA: Insufficient documentation

## 2017-03-01 DIAGNOSIS — R4182 Altered mental status, unspecified: Secondary | ICD-10-CM | POA: Insufficient documentation

## 2017-03-01 DIAGNOSIS — F1994 Other psychoactive substance use, unspecified with psychoactive substance-induced mood disorder: Secondary | ICD-10-CM | POA: Insufficient documentation

## 2017-03-01 DIAGNOSIS — F1010 Alcohol abuse, uncomplicated: Secondary | ICD-10-CM

## 2017-03-01 DIAGNOSIS — Y9389 Activity, other specified: Secondary | ICD-10-CM | POA: Insufficient documentation

## 2017-03-01 DIAGNOSIS — Y999 Unspecified external cause status: Secondary | ICD-10-CM | POA: Insufficient documentation

## 2017-03-01 DIAGNOSIS — F10920 Alcohol use, unspecified with intoxication, uncomplicated: Secondary | ICD-10-CM | POA: Insufficient documentation

## 2017-03-01 DIAGNOSIS — W010XXA Fall on same level from slipping, tripping and stumbling without subsequent striking against object, initial encounter: Secondary | ICD-10-CM | POA: Insufficient documentation

## 2017-03-01 LAB — URINE DRUG SCREEN, QUALITATIVE (ARMC ONLY)
Amphetamines, Ur Screen: NOT DETECTED
Barbiturates, Ur Screen: NOT DETECTED
Benzodiazepine, Ur Scrn: NOT DETECTED
CANNABINOID 50 NG, UR ~~LOC~~: NOT DETECTED
Cocaine Metabolite,Ur ~~LOC~~: NOT DETECTED
MDMA (ECSTASY) UR SCREEN: NOT DETECTED
Methadone Scn, Ur: NOT DETECTED
OPIATE, UR SCREEN: NOT DETECTED
PHENCYCLIDINE (PCP) UR S: NOT DETECTED
Tricyclic, Ur Screen: NOT DETECTED

## 2017-03-01 LAB — CBC WITH DIFFERENTIAL/PLATELET
BASOS PCT: 1 %
Basophils Absolute: 0.1 10*3/uL (ref 0–0.1)
Eosinophils Absolute: 0.1 10*3/uL (ref 0–0.7)
Eosinophils Relative: 1 %
HEMATOCRIT: 45.2 % (ref 40.0–52.0)
HEMOGLOBIN: 15.6 g/dL (ref 13.0–18.0)
Lymphocytes Relative: 30 %
Lymphs Abs: 3 10*3/uL (ref 1.0–3.6)
MCH: 32 pg (ref 26.0–34.0)
MCHC: 34.6 g/dL (ref 32.0–36.0)
MCV: 92.5 fL (ref 80.0–100.0)
MONOS PCT: 8 %
Monocytes Absolute: 0.8 10*3/uL (ref 0.2–1.0)
NEUTROS ABS: 6.1 10*3/uL (ref 1.4–6.5)
NEUTROS PCT: 60 %
Platelets: 229 10*3/uL (ref 150–440)
RBC: 4.89 MIL/uL (ref 4.40–5.90)
RDW: 12.8 % (ref 11.5–14.5)
WBC: 10 10*3/uL (ref 3.8–10.6)

## 2017-03-01 LAB — SALICYLATE LEVEL

## 2017-03-01 LAB — COMPREHENSIVE METABOLIC PANEL
ALBUMIN: 4.2 g/dL (ref 3.5–5.0)
ALK PHOS: 75 U/L (ref 38–126)
ALT: 46 U/L (ref 17–63)
ANION GAP: 13 (ref 5–15)
AST: 28 U/L (ref 15–41)
BILIRUBIN TOTAL: 0.4 mg/dL (ref 0.3–1.2)
BUN: 12 mg/dL (ref 6–20)
CALCIUM: 9 mg/dL (ref 8.9–10.3)
CO2: 22 mmol/L (ref 22–32)
CREATININE: 1.11 mg/dL (ref 0.61–1.24)
Chloride: 109 mmol/L (ref 101–111)
GFR calc Af Amer: >60 mL/min (ref >60)
GFR calc non Af Amer: >60 mL/min (ref >60)
GLUCOSE: 106 mg/dL — AB (ref 65–99)
Potassium: 3.8 mmol/L (ref 3.5–5.1)
Sodium: 144 mmol/L (ref 135–145)
TOTAL PROTEIN: 7.7 g/dL (ref 6.5–8.1)

## 2017-03-01 LAB — ACETAMINOPHEN LEVEL: Acetaminophen (Tylenol), Serum: <10 ug/mL — ABNORMAL LOW (ref 10–30)

## 2017-03-01 LAB — ETHANOL: Alcohol, Ethyl (B): 232 mg/dL — ABNORMAL HIGH (ref <10)

## 2017-03-01 MED ORDER — ACETAMINOPHEN 500 MG PO TABS
ORAL_TABLET | ORAL | Status: AC
  Filled 2017-03-01: qty 2

## 2017-03-01 MED ORDER — ACETAMINOPHEN 500 MG PO TABS: 1000.0000 mg | ORAL_TABLET | Freq: Once | ORAL | Status: DC

## 2017-03-01 MED ORDER — DIPHENHYDRAMINE HCL 50 MG/ML IJ SOLN
INTRAMUSCULAR | Status: AC
  Filled 2017-03-01: qty 1

## 2017-03-01 MED ORDER — LORAZEPAM 2 MG/ML IJ SOLN
INTRAMUSCULAR | Status: AC
  Filled 2017-03-01: qty 1

## 2017-03-01 MED ORDER — HALOPERIDOL LACTATE 5 MG/ML IJ SOLN
INTRAMUSCULAR | Status: AC
  Filled 2017-03-01: qty 1

## 2017-03-01 MED ORDER — LORAZEPAM 2 MG/ML IJ SOLN
2.0000 mg | Freq: Once | INTRAMUSCULAR | Status: AC
  Administered 2017-03-01: 2 mg via INTRAMUSCULAR

## 2017-03-01 MED ORDER — THIAMINE HCL 100 MG/ML IJ SOLN
Freq: Once | INTRAVENOUS | Status: DC
  Filled 2017-03-01: qty 1000

## 2017-03-01 MED ORDER — DIPHENHYDRAMINE HCL 50 MG/ML IJ SOLN
25.0000 mg | Freq: Once | INTRAMUSCULAR | Status: AC
  Administered 2017-03-01: 25 mg via INTRAMUSCULAR

## 2017-03-01 MED ORDER — SODIUM CHLORIDE 0.9 % IV BOLUS (SEPSIS): 1000.0000 mL | Freq: Once | INTRAVENOUS | Status: DC

## 2017-03-01 MED ORDER — HALOPERIDOL LACTATE 5 MG/ML IJ SOLN
5.0000 mg | Freq: Once | INTRAMUSCULAR | Status: AC
  Administered 2017-03-01: 5 mg via INTRAMUSCULAR

## 2017-03-01 NOTE — ED Notes (Signed)
BEHAVIORAL HEALTH ROUNDING Patient sleeping: Yes.   Patient alert and oriented: not applicable SLEEPING Behavior appropriate: Yes.  ; If no, describe: SLEEPING Nutrition and fluids offered: No SLEEPING Toileting and hygiene offered: NoSLEEPING Sitter present: not applicable, Q 15 min safety rounds and observation. Law enforcement present: Yes ODS 

## 2017-03-01 NOTE — ED Notes (Signed)
Patient transported to X-ray 

## 2017-03-01 NOTE — ED Notes (Signed)
BEHAVIORAL HEALTH ROUNDING  Patient sleeping: No.  Patient alert and oriented: yes  Behavior appropriate: Yes. ; If no, describe:  Nutrition and fluids offered: Yes  Toileting and hygiene offered: Yes  Sitter present: not applicable, Q 15 min safety rounds and observation.  Law enforcement present: Yes ODS  

## 2017-03-01 NOTE — ED Notes (Signed)
Patient sleeping

## 2017-03-01 NOTE — ED Notes (Signed)
This RN informed that patient became agitated and refused both CT and Xray. Patient brought back to 1H.

## 2017-03-01 NOTE — ED Notes (Signed)
Fall mats placed around bed. Fall risk bracelet placed on patient. Patient instructed not to stand without assistance from RN or NT. Patient verbalized understanding of these instructions.

## 2017-03-01 NOTE — ED Notes (Signed)
[employer] officer pride to bedside to use metal detector on patient to search for additional weapons. No weapons found.

## 2017-03-01 NOTE — ED Triage Notes (Signed)
Patient states: "I just gave up. I just sat there, laid down and thought about things. I am just done. I don't want to hurt myself, but I give up on everything."

## 2017-03-01 NOTE — ED Notes (Signed)
This RN, ODS officer, MD and charge RN attempted to de-escalate patient verbally. Patient continued to refuse all treatment and requests. Patient indicated that he would physically assault any hospital employee or officer that attempted to touch him, give meds or draw blood.   This RN again tried to verbally deescalate patient. Patient continues to be agitated and verbally aggressive. Patient continues to indicate that he will physically assault staff.

## 2017-03-01 NOTE — ED Notes (Signed)
This RN went to bedside to attempt IV insertion/blood draw. Patient requested left arm be used. This RN visualized a pocket knife attached to patient's left pocket. Patient had denied presence of any weapons during triage. This RN informed patient that he could not keep the knife due to hospital policy and that knife would be labeled and locked up. Patient verbalized understanding and gave consent for RN to take the knife.

## 2017-03-01 NOTE — ED Notes (Signed)
Pt c/o pain to foot, refused to take tylenol however. Pt will not speak to this RN, he covered his head with the blanket.

## 2017-03-01 NOTE — ED Notes (Signed)
Xray tech called and informed that patient now calm/sleeping and available for scan.

## 2017-03-01 NOTE — ED Provider Notes (Signed)
[facility] Emergency Department Provider Note   ____________________________________________   First MD Initiated Contact with Patient 03/01/17 0201     (approximate)  I have reviewed the triage vital signs and the nursing notes.   HISTORY  Chief Complaint No chief complaint on file.    HPI Ronald Logan is a 47 y.o. male brought to the ED from the street via [place] police.  He was found in the grass by the side of the road.  Found to have abrasion to the side of his face and appears intoxicated.  The patient denies fall.  States he laid down by the side of the road because "I give up".  Asked him what he meant by that and he averted eye contact and refused to answer questions.  Told the nurse he did not want to hurt himself but he gives up on everything.  States Kateri Mc Sam needs to pay him his "damn money".  Has a cast on his right foot for prior break.  Denies recent fever, chills, chest pain, shortness of breath, abdominal pain, nausea, vomiting.   Past medical history Denies psychiatric history  Patient Active Problem List   Diagnosis Date Noted  . Ankle fracture, bimalleolar, closed, right, initial encounter 01/27/2017    Past Surgical History:  Procedure Laterality Date  . ORIF ANKLE FRACTURE Right 01/27/2017   Procedure: OPEN REDUCTION INTERNAL FIXATION (ORIF) ANKLE FRACTURE;  Surgeon: Christena Flake, MD;  Location: [facility] ORS;  Service: Orthopedics;  Laterality: Right;    Prior to Admission medications   Medication Sig Start Date End Date Taking? Authorizing Provider  albuterol (PROVENTIL HFA;VENTOLIN HFA) 108 (90 Base) MCG/ACT inhaler Inhale 2 puffs into the lungs every 6 (six) hours as needed for wheezing or shortness of breath.   Yes [provider]  oxyCODONE (OXY IR/ROXICODONE) 5 MG immediate release tablet Take 1-2 tablets (5-10 mg total) by mouth every 4 (four) hours as needed for moderate pain. 01/28/17  Yes Anson [place], PA-C    Allergies Patient has no known allergies.  No family history on file.  Social History Social History   Tobacco Use  . Smoking status: Heavy Tobacco Smoker  . Smokeless tobacco: Current User  Substance Use Topics  . Alcohol use: Yes  . Drug use: Yes    Review of Systems  Constitutional: No fever/chills. Eyes: No visual changes. ENT: No sore throat. Cardiovascular: Denies chest pain. Respiratory: Denies shortness of breath. Gastrointestinal: No abdominal pain.  No nausea, no vomiting.  No diarrhea.  No constipation. Genitourinary: Negative for dysuria. Musculoskeletal: Negative for back pain. Skin: Negative for rash. Neurological: Negative for headaches, focal weakness or numbness. Psychiatric:Positive for bizarre behavior.  ____________________________________________   PHYSICAL EXAM:  VITAL SIGNS: ED Triage Vitals  Enc Vitals Group     BP 03/01/17 0150 (!) 134/106     Pulse Rate 03/01/17 0150 97     Resp 03/01/17 0150 15     Temp 03/01/17 0150 (!) 96.8 F (36 C)     Temp src --      SpO2 03/01/17 0150 97 %     Weight 03/01/17 0158 155 lb (70.3 kg)     Height --      Head Circumference --      Peak Flow --      Pain Score 03/01/17 0157 10     Pain Loc --      Pain Edu? --      Excl.  in GC? --     Constitutional: Alert and oriented.  Disheveled appearing and in no acute distress. Eyes: Conjunctivae are normal. PERRL. EOMI. Head: Tiny abrasion to right cheek. Nose: No deformities. Mouth/Throat: No dental malocclusion.  Neck: No stridor.  No cervical spine tenderness to palpation. Cardiovascular: Normal rate, regular rhythm. Grossly normal heart sounds.  Good peripheral circulation. Respiratory: Normal respiratory effort.  No retractions. Lungs CTAB. Gastrointestinal: Soft and nontender. No distention. No abdominal bruits. No CVA tenderness. Musculoskeletal: Cast on the right foot.  Toes are pink with full range of motion.  Brisk, less  than 5-second capillary refill.  No lower extremity tenderness nor edema. No joint effusions. Neurologic: Intoxicated.  Normal speech and language. No gross focal neurologic deficits are appreciated.  Skin:  Skin is warm, dry and intact. No rash noted. Psychiatric: Mood and affect are bizarre. Speech and behavior are normal.  ____________________________________________   LABS (all labs ordered are listed, but only abnormal results are displayed)  Labs Reviewed  COMPREHENSIVE METABOLIC PANEL - Abnormal; Notable for the following components:      Result Value   Glucose, Bld 106 (*)    All other components within normal limits  ETHANOL - Abnormal; Notable for the following components:   Alcohol, Ethyl (B) 232 (*)    All other components within normal limits  ACETAMINOPHEN LEVEL - Abnormal; Notable for the following components:   Acetaminophen (Tylenol), Serum <10 (*)    All other components within normal limits  CBC WITH DIFFERENTIAL/PLATELET  SALICYLATE LEVEL  URINE DRUG SCREEN, QUALITATIVE ([facility] ONLY)   ____________________________________________  EKG  None ____________________________________________  RADIOLOGY  ED MD interpretation: No ICH, no acute cardiopulmonary process.  Official radiology report(s): Dg Chest 1 View  Result Date: 03/01/2017 CLINICAL DATA:  Altered mental status and intoxication. Patient is unresponsive. EXAM: CHEST 1 VIEW COMPARISON:  None. FINDINGS: Shallow inspiration. Heart size and pulmonary vascularity are normal. No focal airspace disease or consolidation in the lungs. Peribronchial thickening with slight central interstitial pattern suggesting chronic bronchitis. Calcified granuloma in the left mid lung. Calcification of the aorta. No blunting of costophrenic angles. No pneumothorax. IMPRESSION: Chronic bronchitic changes in the lungs. No evidence of active pulmonary disease. Aortic atherosclerosis. Electronically Signed   By: Burman Nieves M.D.    On: 03/01/2017 04:48   Ct Head Wo Contrast  Result Date: 03/01/2017 CLINICAL DATA:  Patient was found in the grass. Casted foot with pain. Alcohol use. EXAM: CT HEAD WITHOUT CONTRAST TECHNIQUE: Contiguous axial images were obtained from the base of the skull through the vertex without intravenous contrast. COMPARISON:  01/27/2017 FINDINGS: Brain: Ventricles and sulci appear symmetrical. No mass effect or midline shift. No abnormal extra-axial fluid collections. Gray-white matter junctions are distinct. Basal cisterns are not effaced. No acute intracranial hemorrhage. Vascular: No hyperdense vessel or unexpected calcification. Skull: Calvarium appears intact. Sinuses/Orbits: Opacification of some of the ethmoid air cells. No acute air-fluid levels in the paranasal sinuses. Mastoid air cells are not opacified. Other: None. IMPRESSION: No acute intracranial abnormalities. Electronically Signed   By: Burman Nieves M.D.   On: 03/01/2017 05:10    ____________________________________________   PROCEDURES  Procedure(s) performed: None  Procedures  Critical Care performed:   CRITICAL CARE Performed by: Irean Hong   Total critical care time: 45 minutes  Critical care time was exclusive of separately billable procedures and treating other patients.  Critical care was necessary to treat or prevent imminent or life-threatening deterioration.  Critical care was time  spent personally by me on the following activities: development of treatment plan with patient and/or surrogate as well as nursing, discussions with consultants, evaluation of patient's response to treatment, examination of patient, obtaining history from patient or surrogate, ordering and performing treatments and interventions, ordering and review of laboratory studies, ordering and review of radiographic studies, pulse oximetry and re-evaluation of patient's condition.  ____________________________________________   INITIAL  IMPRESSION / ASSESSMENT AND PLAN / ED COURSE  As part of my medical decision making, I reviewed the following data within the electronic MEDICAL RECORD NUMBER Nursing notes reviewed and incorporated, Labs reviewed, Old chart reviewed, Radiograph reviewed, A consult was requested and obtained from this/these consultant(s) Psychiatry and Notes from prior ED visits.   47 year old male brought by police found on the side of the road intoxicated with bizarre behavior.  Making vague suicidal threats.  Will obtain screening toxicological lab work and urinalysis, CT head, chest x-ray per patient's request although he is not coughing or having difficulty breathing.  Will place patient under involuntary commitment for his safety and [facility].  Clinical Course as of Mar 02 631  Wynelle LinkSun Mar 01, 2017  [id number] Patient physically threatened the CT tech and escalated his behavior.  He is not able to be verbally redirected and requires IM calming agent.  [JS]  [id number] [facility] Dr[facility] Sherlon Handingodriguez.  Unfortunately, he was sleepy and sedated after IM calming agent and could not properly dissipate in psychiatric interview.  Per her recommendation, we will continue IVC and [facility] once patient is awake and able to participate the interview process.  [JS]  [id number] Patient remains asleep distress.  Vital signs are stable.  He will remain in the ED under IVC.  [facility] patient is unable to participate in interview.  [JS]    Clinical Course User Index [JS] Irean HongSung, Jade J, MD     ____________________________________________   FINAL CLINICAL IMPRESSION(S) / ED DIAGNOSES  Final diagnoses:  Alcoholic intoxication without complication [facility](HCC)     ED Discharge Orders    None       Note:  This document was prepared using Dragon voice recognition software and may include unintentional dictation errors.    Irean HongSung, Jade J, MD 03/01/17 [phone]

## 2017-03-01 NOTE — ED Notes (Signed)
ENVIRONMENTAL ASSESSMENT  Potentially harmful objects out of patient reach: Yes.  Personal belongings secured: Yes.  Patient dressed in hospital provided attire only: Yes.  Plastic bags out of patient reach: Yes.  Patient care equipment (cords, cables, call bells, lines, and drains) shortened, removed, or accounted for: Yes.  Equipment and supplies removed from bottom of stretcher: Yes.  Potentially toxic materials out of patient reach: Yes.  Sharps container removed or out of patient reach: Yes.   BEHAVIORAL HEALTH ROUNDING Patient sleeping: Yes.   Patient alert and oriented: not applicable SLEEPING Behavior appropriate: Yes.  ; If no, describe: SLEEPING Nutrition and fluids offered: No SLEEPING Toileting and hygiene offered: NoSLEEPING Sitter present: not applicable, Q 15 min safety rounds and observation. Law enforcement present: Yes ODS  ED BHU PLACEMENT JUSTIFICATION  Is the patient under IVC or is there intent for IVC: Yes.  Is the patient medically cleared: Yes.  Is there vacancy in the ED BHU: Yes.  Is the population mix appropriate for patient: no.  Is the patient awaiting placement in inpatient or outpatient setting: Yes.  Has the patient had a psychiatric consult: no.  Survey of unit performed for contraband, proper placement and condition of furniture, tampering with fixtures in bathroom, shower, and each patient room: Yes. ; Findings: All clear  APPEARANCE/BEHAVIOR  sleeping NEURO ASSESSMENT  Orientation: sleeping Hallucinations:sleeping Speech: sleeping Gait: sleeping: RESPIRATORY ASSESSMENT  WNL  CARDIOVASCULAR ASSESSMENT  WNL  GASTROINTESTINAL ASSESSMENT  WNL  EXTREMITIES  WNL  PLAN OF CARE  Provide calm/safe environment. Vital signs assessed TID. ED BHU Assessment once each 12-hour shift. Collaborate with TTS daily or as condition indicates. Assure the ED provider has rounded once each shift. Provide and encourage hygiene. Provide redirection as needed.  Assess for escalating behavior; address immediately and inform ED provider.  Assess family dynamic and appropriateness for visitation as needed: Yes. ; If necessary, describe findings:  Educate the patient/family about BHU procedures/visitation: Yes. ; If necessary, describe findings: Pt is calm and cooperative at this time. Pt understanding and accepting of unit procedures/rules. Will continue to monitor with Q 15 min safety rounds and observation.

## 2017-03-01 NOTE — ED Notes (Signed)
Patient talking to SOC 

## 2017-03-01 NOTE — ED Triage Notes (Signed)
Patient has laceration to left eye, dried blood present.

## 2017-03-01 NOTE — ED Notes (Signed)
This RN, Nurse, children'sebecca RN, NT, [employer] officers, and [employer] officer Pride to bedside. This RN requested patient agree to take prescribed medications (IV fluids, haldol, ativan, and benadryl). Patient refused and again threatened staff.   Officers and NT assisted Armed forces technical officerJenna RN and Nurse, children'sebecca RN with medication administration.

## 2017-03-01 NOTE — ED Notes (Signed)
ED Provider at bedside. 

## 2017-03-01 NOTE — ED Notes (Signed)
MD informed of patient's refusal of treatment. [employer] officer to bedside.

## 2017-03-01 NOTE — ED Notes (Signed)
RN back to bedside after giving labeled knife to [employer] officer to secure. Patient now agitated and refusing IV insertion/blood draw.   This RN explained to patient that he was under IVC and that he therefore could not refuse treatment. This RN also explained Patient further reported that he would not allow this RN to attempt IV access/blood draw.

## 2017-03-01 NOTE — ED Notes (Signed)
Patient returned from CT/Xray

## 2017-03-01 NOTE — ED Provider Notes (Addendum)
-----------------------------------------   9:34 AM on 03/01/2017 -----------------------------------------  Patient remains quite somnolent.  At one point had briefly desatted, we woke the patient up and he came back to 98% room air saturation.  Patient still not alert enough to adequately be assessed by psychiatry.  We will continue to monitor in the emergency department until more alert and then have specialist on-call evaluate.  ----------------------------------------- 12:52 PM on 03/01/2017 -----------------------------------------  Patient is now awake alert and talking.  Patient is still refusing SOC.  Turn the machine off.  States he does not want to talk to a psychiatrist.  I discussed this with the patient, he states he does not need to talk to a psychiatrist.  He denies any SI or HI.  States he was just drinking last night and he fell.  Denies any intention to hurt himself.  The patient denies SI or HI and is refusing to speak to a psychiatrist anyways, states he does not need to speak to a psychiatrist states he wants to go home.  I believe the patient is safe for discharge home at this time.  To myself he is polite, answers questions appropriately, adamantly denies any SI or HI does admit to drinking too much last night.  I will rescind the patient's IVC we will discharge home.   Minna AntisPaduchowski, Janice Seales, MD 03/01/17 1253  ----------------------------------------- 1:08 PM on 03/01/2017 -----------------------------------------  Patient was given his phone, now refusing to call for a ride.  When asked what he wishes to do he just covers his head up with a blanket and refuses to talk.  I continue to ask, he says "I just do not know."  I asked if he has a place to go to any says yes when asked where it is he says "I do not know."  Given the patient's continued difficulty refusing to answer questions, do not believe I can safely [place] IVC if he has no one to come pick him up in order to go.   We will continue the IVC until either he agrees to see telemetry psychiatry today if not we will have the in-house psychiatrist see him tomorrow.    Minna AntisPaduchowski, Ertha Nabor, MD 03/01/17 1311

## 2017-03-01 NOTE — ED Notes (Signed)
Patient dressed out by this RN, Danelle EarthlyNoel RN, and NT. Patient placed in burgundy scrubs. Patients belongings and crutches labeled and secured. Patient's belongings include:  2 crutches, 1 pair pants, 1 pair underwear, 2 shirts, 1 shoe, 1 sock, 1 wallet, 1 cell phone, 1 pack of cigarettes, 1 black head wrap.

## 2017-03-01 NOTE — ED Triage Notes (Signed)
Patient found in grass by [employer] on [address]. Patient has cast on right foot for previous break. Patient's foot cool to touch with cap refill < 3 seconds.   Patient reports pain in foot is worse than normal. Patient's cast on underside of foot is soft/flexable.   Patient reports ETOH on board. Patient states "couple of beers".

## 2017-03-01 NOTE — BH Assessment (Signed)
Tele Assessment Note   Patient Name: Ronald Logan MRN: [id number] Referring Physician:  Location of Patient:  Location of Provider: Behavioral Health TTS Department  Ronald Logan is an 47 y.o. male.  Patient was drowsy and refusing to fully cooperate with assessment. He stated last night was a long night and he presented to ED himself for assistance; Pt denies current and past SI/HI; Denies A/V hallucinations;  Denies depression; Denies being on Psych Medications;  Patient states he lives in [place];   Diagnosis: Alcohol Use Disorder, Severe Axis II: deferred Axis III: refer to medical notes Axis IV:   Past Medical History: No past medical history on file.  Past Surgical History:  Procedure Laterality Date  . ORIF ANKLE FRACTURE Right 01/27/2017   Procedure: OPEN REDUCTION INTERNAL FIXATION (ORIF) ANKLE FRACTURE;  Surgeon: Christena FlakePoggi, John J, MD;  Location: [facility] ORS;  Service: Orthopedics;  Laterality: Right;    Family History: No family history on file.  Social History:  reports that he has been smoking.  He uses smokeless tobacco. He reports that he drinks alcohol. He reports that he uses drugs.  Additional Social History:     CIWA: CIWA-Ar BP: 123/83 Pulse Rate: 83 COWS:    Allergies: No Known Allergies  Home Medications:  (Not in a hospital admission)  OB/GYN Status:  No LMP for male patient.  General Assessment Data Location of Assessment: [facility] ED TTS Assessment: In system Is this a Tele or Face-to-Face Assessment?: Face-to-Face Is this an Initial Assessment or a Re-assessment for this encounter?: Initial Assessment Can pt return to current living arrangement?: Yes Admission Status: Involuntary Is patient capable of signing voluntary admission?: Yes           Risk to self with the past 6 months Suicidal Ideation: No Has patient been a risk to self within the past 6 months prior to admission? : Yes Suicidal Intent: No Has patient had any  suicidal intent within the past 6 months prior to admission? : No Is patient at risk for suicide?: No Suicidal Plan?: No Has patient had any suicidal plan within the past 6 months prior to admission? : No Access to Means: Yes Specify Access to Suicidal Means: household items What has been your use of drugs/alcohol within the last 12 months?: alcohol  Previous Attempts/Gestures: No How many times?: 0 Triggers for Past Attempts: None known Family Suicide History: Unable to assess Persecutory voices/beliefs?: No Depression: No Substance abuse history and/or treatment for substance abuse?: Yes Suicide prevention information given to non-admitted patients: Yes  Risk to Others within the past 6 months Homicidal Ideation: No Does patient have any lifetime risk of violence toward others beyond the six months prior to admission? : No Thoughts of Harm to Others: No Current Homicidal Intent: No Current Homicidal Plan: No Access to Homicidal Means: Yes Describe Access to Homicidal Means: household items Identified Victim: None History of harm to others?: No Assessment of Violence: None Noted Does patient have access to weapons?: No  Psychosis Hallucinations: None noted Delusions: None noted  Mental Status Report Appearance/Hygiene: Disheveled, In scrubs Eye Contact: Poor Motor Activity: Agitation Speech: Soft Level of Consciousness: Sleeping, Drowsy Mood: Irritable Affect: Irritable Anxiety Level: None Thought Processes: Coherent, Relevant Judgement: Impaired Obsessive Compulsive Thoughts/Behaviors: None  Cognitive Functioning Concentration: Normal Memory: Unable to Assess IQ: Average Insight: Poor Impulse Control: Poor Appetite: Fair Weight Loss: 0 Weight Gain: 0 Sleep: Unable to Assess Total Hours of Sleep: 8 Vegetative Symptoms: None  ADLScreening [facility]  Assessment Services) Patient's cognitive ability adequate to safely complete daily activities?: Yes Patient able  to express need for assistance with ADLs?: Yes Independently performs ADLs?: Yes (appropriate for developmental age)        ADL Screening (condition at time of admission) Patient's cognitive ability adequate to safely complete daily activities?: Yes Patient able to express need for assistance with ADLs?: Yes Independently performs ADLs?: Yes (appropriate for developmental age)                        Disposition:  SOC to Interview Disposition Initial Assessment Completed for this Encounter: Yes Disposition of Patient: Referred to(SOC) Patient referred to: [facility])  This service was provided via telemedicine using a 2-way, interactive audio and Immunologist.  Names of all persons participating in this telemedicine service and their role in this encounter.               Wayland Denis Richmond 03/01/2017 10:12 AM

## 2017-03-01 NOTE — ED Triage Notes (Signed)
Patient refusing to assist with triage questions. Patient states: "I don't want none of that. I want [employer] to damn pay me."

## 2017-03-02 DIAGNOSIS — F1994 Other psychoactive substance use, unspecified with psychoactive substance-induced mood disorder: Secondary | ICD-10-CM

## 2017-03-02 DIAGNOSIS — F1010 Alcohol abuse, uncomplicated: Secondary | ICD-10-CM

## 2017-03-02 NOTE — ED Notes (Signed)
Patient observed lying in bed with eyes closed  Even, unlabored respirations observed   NAD pt appears to be sleeping  I will continue to monitor along with every 15 minute visual observations and ongoing security monitoring    

## 2017-03-02 NOTE — ED Notes (Signed)
BEHAVIORAL HEALTH ROUNDING Patient sleeping: Yes.   Patient alert and oriented: not applicable SLEEPING Behavior appropriate: Yes.  ; If no, describe: SLEEPING Nutrition and fluids offered: No SLEEPING Toileting and hygiene offered: NoSLEEPING Sitter present: not applicable, Q 15 min safety rounds and observation. Law enforcement present: Yes ODS 

## 2017-03-02 NOTE — ED Notes (Addendum)
BEHAVIORAL HEALTH ROUNDING Patient sleeping: Yes.   Patient alert and oriented: not applicable SLEEPING Behavior appropriate: Yes.  ; If no, describe: SLEEPING Nutrition and fluids offered: No SLEEPING Toileting and hygiene offered: NoSLEEPING Sitter present: not applicable, Q 15 min safety rounds and observation. Law enforcement present: Yes ODS 

## 2017-03-02 NOTE — ED Notes (Signed)
BEHAVIORAL HEALTH ROUNDING Patient sleeping: Yes.   Patient alert and oriented: eyes closed  Appears to be asleep Behavior appropriate: Yes.  ; If no, describe:  Nutrition and fluids offered: Yes  Toileting and hygiene offered: sleeping Sitter present: q 15 minute observations and security monitoring Law enforcement present: yes  ODS 

## 2017-03-02 NOTE — ED Provider Notes (Signed)
The patient has been evaluated at bedside by Dr. Toni Amendlapacs, psychiatry.  Patient is clinically stable.  Not felt to be a danger to self or others.  No SI or Hi.  No indication for inpatient psychiatric admission at this time.  Appropriate for continued outpatient therapy.   Willy Eddyobinson, Camille Thau, MD 03/02/17 1326

## 2017-03-02 NOTE — ED Notes (Signed)

## 2017-03-02 NOTE — Consult Note (Signed)
[facility] Face-to-Face Psychiatry Consult   Reason for Consult: Consult for 46-year-old man presented to the emergency room intoxicated Referring Physician: Robinson Patient Identification: Ronald Logan MRN:  [id number] Principal Diagnosis: Substance induced mood disorder (HCC) Diagnosis:   Patient Active Problem List   Diagnosis Date Noted  . Substance induced mood disorder (HCC) [F19.94] 03/02/2017    Priority: High  . Alcohol abuse [F10.10] 03/02/2017    Priority: Medium  . Ankle fracture, bimalleolar, closed, right, initial encounter [S82.841A] 01/27/2017    Total Time spent with patient: 1 hour  Subjective:   Ronald Logan States is a 46 y.o. male patient admitted with "I do not know why I am here, I just hurt my foot".  HPI: Patient interviewed chart reviewed.  46-year-old man was brought into the emergency room after police found him passed out in public intoxicated.  The patient tells me today that he and his girlfriend got into an argument this weekend.  He went off to take a walk outside and fell down and was not able to stand back up.  Patient admits he had been drinking at the time but minimizes the effect of it saying it was only "a couple of beers".  Denies using any other drugs.  He says his mood otherwise has been good.  Denies any suicidal thoughts.  Denies any sleep problems.  Does not feel like he has a problem with alcohol.  Denies any psychotic symptoms.  Patient evidently may have made passive suicidal statements during his intoxicated state.  Social history: Patient says he does have a place to stay.  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  Medical history: Broke his foot on New Year's.  Still has a cast around his right ankle.  Substance abuse history: Patient claims to not believe he has any kind of alcohol or drug history.  He said he "hardly drinks at all".  No history of withdrawal seizures DTs or treatment.  Past  Psychiatric History: Denies any past psychiatric history.  Never been in a psych hospital.  Never been on any medicine.  Denies any suicide attempts.  Risk to Self: Suicidal Ideation: No Suicidal Intent: No Is patient at risk for suicide?: No Suicidal Plan?: No Access to Means: Yes Specify Access to Suicidal Means: household items What has been your use of drugs/alcohol within the last 12 months?: alcohol  How many times?: 0 Triggers for Past Attempts: None known Risk to Others: Homicidal Ideation: No Thoughts of Harm to Others: No Current Homicidal Intent: No Current Homicidal Plan: No Access to Homicidal Means: Yes Describe Access to Homicidal Means: household items Identified Victim: None History of harm to others?: No Assessment of Violence: None Noted Does patient have access to weapons?: No Prior Inpatient Therapy:   Prior Outpatient Therapy:    Past Medical History: No past medical history on file.  Past Surgical History:  Procedure Laterality Date  . ORIF ANKLE FRACTURE Right 01/27/2017   Procedure: OPEN REDUCTION INTERNAL FIXATION (ORIF) ANKLE FRACTURE;  Surgeon: Poggi,  J, MD;  Location: [facility] ORS;  Service: Orthopedics;  Laterality: Right;   Family History: No family history on file. Family Psychiatric  History: Denies any family history Social History:  Social History   Substance and Sexual Activity  Alcohol Use Yes     Social History   Substance and Sexual Activity  Drug Use Yes    Social History   Socioeconomic History  .   Marital status: Married    Spouse name: Not on file  . Number of children: Not on file  . Years of education: Not on file  . Highest education level: Not on file  Social Needs  . Financial resource strain: Not on file  . Food insecurity - worry: Not on file  . Food insecurity - inability: Not on file  . Transportation needs - medical: Not on file  . Transportation needs - non-medical: Not on file  Occupational History  . Not  on file  Tobacco Use  . Smoking status: Heavy Tobacco Smoker  . Smokeless tobacco: Current User  Substance and Sexual Activity  . Alcohol use: Yes  . Drug use: Yes  . Sexual activity: Yes    Birth control/protection: None  Other Topics Concern  . Not on file  Social History Narrative  . Not on file   Additional Social History:    Allergies:  No Known Allergies  Labs:  Results for orders placed or performed during the hospital encounter of 03/01/17 (from the past 48 hour(s))  Urine Drug Screen, Qualitative     Status: None   Collection Time: 03/01/17  2:35 AM  Result Value Ref Range   Tricyclic, Ur Screen NONE DETECTED NONE DETECTED   Amphetamines, Ur Screen NONE DETECTED NONE DETECTED   MDMA (Ecstasy)Ur Screen NONE DETECTED NONE DETECTED   Cocaine Metabolite,Ur [place] NONE DETECTED NONE DETECTED   Opiate, Ur Screen NONE DETECTED NONE DETECTED   Phencyclidine (PCP) Ur S NONE DETECTED NONE DETECTED   Cannabinoid 50 Ng, Ur [place] NONE DETECTED NONE DETECTED   Barbiturates, Ur Screen NONE DETECTED NONE DETECTED   Benzodiazepine, Ur Scrn NONE DETECTED NONE DETECTED   Methadone Scn, Ur NONE DETECTED NONE DETECTED    Comment: (NOTE) Tricyclics + metabolites, urine    Cutoff 1000 ng/mL Amphetamines + metabolites, urine  Cutoff 1000 ng/mL MDMA (Ecstasy), urine              Cutoff 500 ng/mL Cocaine Metabolite, urine          Cutoff 300 ng/mL Opiate + metabolites, urine        Cutoff 300 ng/mL Phencyclidine (PCP), urine         Cutoff 25 ng/mL Cannabinoid, urine                 Cutoff 50 ng/mL Barbiturates + metabolites, urine  Cutoff 200 ng/mL Benzodiazepine, urine              Cutoff 200 ng/mL Methadone, urine                   Cutoff 300 ng/mL The urine drug screen provides only a preliminary, unconfirmed analytical test result and should not be used for non-medical purposes. Clinical consideration and professional judgment should be applied to any positive drug screen result due to  possible interfering substances. A more specific alternate chemical method must be used in order to obtain a confirmed analytical result. Gas chromatography / mass spectrometry (GC/MS) is the preferred confirmat ory method. Performed at [facility], [address] [place], [place] [address]   CBC with Differential     Status: None   Collection Time: 03/01/17  4:23 AM  Result Value Ref Range   WBC 10.0 3.8 - 10.6 K/uL   RBC 4.89 4.40 - 5.90 MIL/uL   Hemoglobin 15.6 13.0 - 18.0 g/dL   HCT 45.2 40.0 - 52.0 %   MCV 92.5 80.0 - 100.0  fL   MCH 32.0 26.0 - 34.0 pg   MCHC 34.6 32.0 - 36.0 g/dL   RDW 12.8 11.5 - 14.5 %   Platelets 229 150 - 440 K/uL   Neutrophils Relative % 60 %   Neutro Abs 6.1 1.4 - 6.5 K/uL   Lymphocytes Relative 30 %   Lymphs Abs 3.0 1.0 - 3.6 K/uL   Monocytes Relative 8 %   Monocytes Absolute 0.8 0.2 - 1.0 K/uL   Eosinophils Relative 1 %   Eosinophils Absolute 0.1 0 - 0.7 K/uL   Basophils Relative 1 %   Basophils Absolute 0.1 0 - 0.1 K/uL    Comment: Performed at [facility], [address] [place], [place] [address]  Comprehensive metabolic panel     Status: Abnormal   Collection Time: 03/01/17  4:23 AM  Result Value Ref Range   Sodium 144 135 - 145 mmol/L   Potassium 3.8 3.5 - 5.1 mmol/L   Chloride 109 101 - 111 mmol/L   CO2 22 22 - 32 mmol/L   Glucose, Bld 106 (H) 65 - 99 mg/dL   BUN 12 6 - 20 mg/dL   Creatinine, Ser 1.11 0.61 - 1.24 mg/dL   Calcium 9.0 8.9 - 10.3 mg/dL   Total Protein 7.7 6.5 - 8.1 g/dL   Albumin 4.2 3.5 - 5.0 g/dL   AST 28 15 - 41 U/L   ALT 46 17 - 63 U/L   Alkaline Phosphatase 75 38 - 126 U/L   Total Bilirubin 0.4 0.3 - 1.2 mg/dL   GFR calc non Af Amer >60 >60 mL/min   GFR calc Af Amer >60 >60 mL/min    Comment: (NOTE) The eGFR has been calculated using the CKD EPI equation. This calculation has not been validated in all clinical situations. eGFR's persistently <60 mL/min signify possible Chronic  Kidney Disease.    Anion gap 13 5 - 15    Comment: Performed at [facility], [address] [place], [place] [address]  Ethanol     Status: Abnormal   Collection Time: 03/01/17  4:23 AM  Result Value Ref Range   Alcohol, Ethyl (B) 232 (H) <10 mg/dL    Comment:        LOWEST DETECTABLE LIMIT FOR SERUM ALCOHOL IS 10 mg/dL FOR MEDICAL PURPOSES ONLY Performed at [facility], [address] [place], [place] [address]   Acetaminophen level     Status: Abnormal   Collection Time: 03/01/17  4:23 AM  Result Value Ref Range   Acetaminophen (Tylenol), Serum <10 (L) 10 - 30 ug/mL    Comment:        THERAPEUTIC CONCENTRATIONS VARY SIGNIFICANTLY. A RANGE OF 10-30 ug/mL MAY BE AN EFFECTIVE CONCENTRATION FOR MANY PATIENTS. HOWEVER, SOME ARE BEST TREATED AT CONCENTRATIONS OUTSIDE THIS RANGE. ACETAMINOPHEN CONCENTRATIONS >150 ug/mL AT 4 HOURS AFTER INGESTION AND >50 ug/mL AT 12 HOURS AFTER INGESTION ARE OFTEN ASSOCIATED WITH TOXIC REACTIONS. Performed at [facility], [address] [place], [place] [address]   Salicylate level     Status: None   Collection Time: 03/01/17  4:23 AM  Result Value Ref Range   Salicylate Lvl <7.0 2.8 - 30.0 mg/dL    Comment: Performed at [facility], [address] [place],  [address]    Current Facility-Administered Medications  Medication Dose Route Frequency Provider Last Rate Last Dose  . acetaminophen (TYLENOL) tablet 1,000 mg  1,000 mg Oral Once Paduchowski, Kevin, MD         Current Outpatient Medications  Medication Sig Dispense Refill  . albuterol (PROVENTIL HFA;VENTOLIN HFA) 108 (90 Base) MCG/ACT inhaler Inhale 2 puffs into the lungs every 6 (six) hours as needed for wheezing or shortness of breath.    . oxyCODONE (OXY IR/ROXICODONE) 5 MG immediate release tablet Take 1-2 tablets (5-10 mg total) by mouth every 4 (four) hours as needed for moderate pain. 60 tablet 0     Musculoskeletal: Strength & Muscle Tone: within normal limits Gait & Station: normal Patient leans: N/A  Psychiatric Specialty Exam: Physical Exam  Nursing note and vitals reviewed. Constitutional: He appears well-developed and well-nourished.  HENT:  Head: Normocephalic and atraumatic.  Eyes: Conjunctivae are normal. Pupils are equal, round, and reactive to light.  Neck: Normal range of motion.  Cardiovascular: Regular rhythm and normal heart sounds.  Respiratory: Effort normal. No respiratory distress.  GI: Soft.  Musculoskeletal: Normal range of motion.       Feet:  Neurological: He is alert.  Skin: Skin is warm and dry.  Psychiatric: He has a normal mood and affect. His speech is normal and behavior is normal. Judgment and thought content normal. Cognition and memory are normal.    Review of Systems  Constitutional: Negative.   HENT: Negative.   Eyes: Negative.   Respiratory: Negative.   Cardiovascular: Negative.   Gastrointestinal: Negative.   Musculoskeletal: Negative.   Skin: Negative.   Neurological: Negative.   Psychiatric/Behavioral: Positive for substance abuse. Negative for depression, hallucinations, memory loss and suicidal ideas. The patient is not nervous/anxious and does not have insomnia.     Blood pressure 114/78, pulse 96, temperature 98.3 F (36.8 C), temperature source Oral, resp. rate 16, weight 70.3 kg (155 lb), SpO2 98 %.Body mass index is 25.79 kg/m.  General Appearance: Casual  Eye Contact:  Good  Speech:  Clear and Coherent  Volume:  Normal  Mood:  Euthymic  Affect:  Congruent  Thought Process:  Goal Directed  Orientation:  Full (Time, Place, and Person)  Thought Content:  Logical  Suicidal Thoughts:  No  Homicidal Thoughts:  No  Memory:  Immediate;   Good Recent;   Good Remote;   Good  Judgement:  Fair  Insight:  Fair  Psychomotor Activity:  Normal  Concentration:  Concentration: Fair  Recall:  Fair  Fund of Knowledge:  Fair   Language:  Fair  Akathisia:  No  Handed:  Right  AIMS (if indicated):     Assets:  Desire for Improvement Housing Physical Health  ADL's:  Intact  Cognition:  WNL  Sleep:        Treatment Plan Summary: Plan 46-year-old man came to the hospital intoxicated with an elevated blood alcohol level.  Mood was thought to be unstable and possibly having suicidal thoughts.  Patient is now sober and completely denies any suicidal ideation.  No evidence of psychosis.  No evidence of withdrawal.  Patient does not meet commitment criteria.  Discontinue IVC.  Patient was counseled about alcohol abuse and the effect on his mood and strongly encouraged to seek outpatient treatment.  Case reviewed with ER doctor and nursing.  Patient can be released from the emergency room.  Disposition: Patient does not meet criteria for psychiatric inpatient admission. Supportive therapy provided about ongoing stressors. Discussed crisis plan, support from social network, calling 911, coming to the Emergency Department, and calling Suicide Hotline.   , MD 03/02/2017 2:53 PM 

## 2017-03-02 NOTE — ED Notes (Signed)
BEHAVIORAL HEALTH ROUNDING Patient sleeping: No. Patient alert and oriented: yes Behavior appropriate: Yes.  ; If no, describe:  Nutrition and fluids offered: yes Toileting and hygiene offered: Yes  Sitter present: q15 minute observations and security  monitoring Law enforcement present: Yes  ODS  

## 2017-03-02 NOTE — ED Notes (Signed)
Dr Clapacs rescinded IVC papers. 

## 2017-03-02 NOTE — ED Notes (Signed)
Clapacs is currently in his room

## 2017-03-02 NOTE — ED Notes (Signed)
ED  Is the patient under IVC or is there intent for IVC:  Now rescinded  Is the patient medically cleared: Yes.   Is there vacancy in the ED BHU: Yes.   Is the population mix appropriate for patient: Yes.   Is the patient awaiting placement in inpatient or outpatient setting:  Has the patient had a psychiatric consult: Yes.   - awaiting consult note  Survey of unit performed for contraband, proper placement and condition of furniture, tampering with fixtures in bathroom, shower, and each patient room: Yes.  ; Findings:  APPEARANCE/BEHAVIOR Calm and cooperative NEURO ASSESSMENT Orientation: oriented x4   Hallucinations: No.None noted (Hallucinations) Speech: Normal Gait: normal RESPIRATORY ASSESSMENT Even  Unlabored respirations  CARDIOVASCULAR ASSESSMENT Pulses equal   regular rate  Skin warm and dry   GASTROINTESTINAL ASSESSMENT no GI complaint EXTREMITIES Full ROM  PLAN OF CARE Provide calm/safe environment. Vital signs assessed twice daily. ED BHU Assessment once each 12-hour shift. Collaborate with TTS when available or as condition indicates. Assure the ED provider has rounded once each shift. Provide and encourage hygiene. Provide redirection as needed. Assess for escalating behavior; address immediately and inform ED provider.  Assess family dynamic and appropriateness for visitation as needed: Yes.  ; If necessary, describe findings:  Educate the patient/family about BHU procedures/visitation: Yes.  ; If necessary, describe findings:

## 2017-03-02 NOTE — ED Provider Notes (Signed)
-----------------------------------------   5:55 AM on 03/02/2017 -----------------------------------------   Blood pressure 114/78, pulse 96, temperature 98.3 F (36.8 C), temperature source Oral, resp. rate 16, weight 70.3 kg (155 lb), SpO2 98 %.  The patient had no acute events since last update.  Calm and cooperative at this time.  Disposition is pending Psychiatry/Behavioral Medicine team recommendations.  I will place psych consult to have Dr. Di Kindlelepacs evaluate patient today.     Irean HongSung, Jade J, MD 03/02/17 [phone]

## 2017-10-29 ENCOUNTER — Emergency Department: Payer: Self-pay

## 2017-10-29 DIAGNOSIS — J45909 Unspecified asthma, uncomplicated: Secondary | ICD-10-CM | POA: Insufficient documentation

## 2017-10-29 DIAGNOSIS — R51 Headache: Secondary | ICD-10-CM | POA: Insufficient documentation

## 2017-10-29 DIAGNOSIS — F17290 Nicotine dependence, other tobacco product, uncomplicated: Secondary | ICD-10-CM | POA: Insufficient documentation

## 2017-10-29 LAB — CBC WITH DIFFERENTIAL/PLATELET
BASOS ABS: 0.1 10*3/uL (ref 0–0.1)
Basophils Relative: 1 %
Eosinophils Absolute: 0.2 10*3/uL (ref 0–0.7)
Eosinophils Relative: 2 %
HEMATOCRIT: 45.4 % (ref 40.0–52.0)
HEMOGLOBIN: 16 g/dL (ref 13.0–18.0)
LYMPHS ABS: 2.8 10*3/uL (ref 1.0–3.6)
LYMPHS PCT: 26 %
MCH: 33.7 pg (ref 26.0–34.0)
MCHC: 35.2 g/dL (ref 32.0–36.0)
MCV: 95.7 fL (ref 80.0–100.0)
Monocytes Absolute: 0.9 10*3/uL (ref 0.2–1.0)
Monocytes Relative: 8 %
NEUTROS ABS: 6.5 10*3/uL (ref 1.4–6.5)
Neutrophils Relative %: 63 %
Platelets: 275 10*3/uL (ref 150–440)
RBC: 4.74 MIL/uL (ref 4.40–5.90)
RDW: 13.2 % (ref 11.5–14.5)
WBC: 10.5 10*3/uL (ref 3.8–10.6)

## 2017-10-29 LAB — COMPREHENSIVE METABOLIC PANEL
ALK PHOS: 75 U/L (ref 38–126)
ALT: 36 U/L (ref 0–44)
AST: 22 U/L (ref 15–41)
Albumin: 4.3 g/dL (ref 3.5–5.0)
Anion gap: 9 (ref 5–15)
BUN: 21 mg/dL — ABNORMAL HIGH (ref 6–20)
CALCIUM: 9.6 mg/dL (ref 8.9–10.3)
CO2: 29 mmol/L (ref 22–32)
CREATININE: 1.21 mg/dL (ref 0.61–1.24)
Chloride: 105 mmol/L (ref 98–111)
Glucose, Bld: 100 mg/dL — ABNORMAL HIGH (ref 70–99)
Potassium: 4.2 mmol/L (ref 3.5–5.1)
Sodium: 143 mmol/L (ref 135–145)
Total Bilirubin: 0.8 mg/dL (ref 0.3–1.2)
Total Protein: 7.7 g/dL (ref 6.5–8.1)
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
## 2017-10-30 ENCOUNTER — Emergency Department
Admission: EM | Admit: 2017-10-30 | Discharge: 2017-10-30 | Disposition: A | Discharge status: Home / Self Care | Payer: Self-pay | Attending: Emergency Medicine | Admitting: Emergency Medicine

## 2017-10-30 DIAGNOSIS — R51 Headache: Secondary | ICD-10-CM

## 2017-10-30 DIAGNOSIS — R519 Headache, unspecified: Secondary | ICD-10-CM

## 2017-10-30 HISTORY — DX: Unspecified asthma, uncomplicated: J45.909

## 2017-10-30 MED ORDER — SODIUM CHLORIDE 0.9 % IV BOLUS
1000.0000 mL | Freq: Once | INTRAVENOUS | Status: AC
  Administered 2017-10-30: 1000 mL via INTRAVENOUS

## 2017-10-30 MED ORDER — BUTALBITAL-APAP-CAFFEINE 50-325-40 MG PO TABS
1.0000 | ORAL_TABLET | Freq: Four times a day (QID) | ORAL | 0 refills | Status: AC | PRN
Start: 2017-10-30 — End: 2018-10-30

## 2017-10-30 MED ORDER — DIPHENHYDRAMINE HCL 50 MG/ML IJ SOLN
25.0000 mg | Freq: Once | INTRAMUSCULAR | Status: AC
  Administered 2017-10-30: 25 mg via INTRAVENOUS
  Filled 2017-10-30: qty 1

## 2017-10-30 MED ORDER — METOCLOPRAMIDE HCL 5 MG/ML IJ SOLN
10.0000 mg | Freq: Once | INTRAMUSCULAR | Status: AC
  Administered 2017-10-30: 10 mg via INTRAVENOUS
  Filled 2017-10-30: qty 2

## 2017-10-30 MED ORDER — KETOROLAC TROMETHAMINE 30 MG/ML IJ SOLN
30.0000 mg | Freq: Once | INTRAMUSCULAR | Status: AC
  Administered 2017-10-30: 30 mg via INTRAVENOUS
  Filled 2017-10-30: qty 1

## 2017-10-30 NOTE — ED Provider Notes (Signed)
[facility] Emergency Department Provider Note    First MD Initiated Contact with Patient 10/30/17 0110     (approximate)  I have reviewed the triage vital signs and the nursing notes.   HISTORY  Chief Complaint Headache    HPI Ronald Logan is a 47 y.o. male with below list of previous medical conditions resents to the emergency department with nontraumatic right side headache and ear pain with associated nausea vomiting photophobia x3 days.  Patient denies any fever no neck pain or stiffness.  Is any weakness numbness gait instability or visual changes.   Past Medical History:  Diagnosis Date  . Asthma     Patient Active Problem List   Diagnosis Date Noted  . Alcohol abuse 03/02/2017  . Substance induced mood disorder (HCC) 03/02/2017  . Ankle fracture, bimalleolar, closed, right, initial encounter 01/27/2017    Past Surgical History:  Procedure Laterality Date  . HAND SURGERY    . ORIF ANKLE FRACTURE Right 01/27/2017   Procedure: OPEN REDUCTION INTERNAL FIXATION (ORIF) ANKLE FRACTURE;  Surgeon: Christena Flake, MD;  Location: [facility] ORS;  Service: Orthopedics;  Laterality: Right;    Prior to Admission medications   Medication Sig Start Date End Date Taking? Authorizing Provider  albuterol (PROVENTIL HFA;VENTOLIN HFA) 108 (90 Base) MCG/ACT inhaler Inhale 2 puffs into the lungs every 6 (six) hours as needed for wheezing or shortness of breath.    [provider]  oxyCODONE (OXY IR/ROXICODONE) 5 MG immediate release tablet Take 1-2 tablets (5-10 mg total) by mouth every 4 (four) hours as needed for moderate pain. 01/28/17   Anson Oregon, PA-C    Allergies No known drug allergies No family history on file.  Social History Social History   Tobacco Use  . Smoking status: Heavy Tobacco Smoker    Types: E-cigarettes, Cigarettes  . Smokeless tobacco: Current User  Substance Use Topics  . Alcohol use: Yes  . Drug use: Yes      Review of Systems Constitutional: No fever/chills Eyes: No visual changes. ENT: No sore throat. Cardiovascular: Denies chest pain. Respiratory: Denies shortness of breath. Gastrointestinal: No abdominal pain.  No nausea, no vomiting.  No diarrhea.  No constipation. Genitourinary: Negative for dysuria. Musculoskeletal: Negative for neck pain.  Negative for back pain. Integumentary: Negative for rash. Neurological: Positive for headaches, negative for focal weakness or numbness.   ____________________________________________   PHYSICAL EXAM:  VITAL SIGNS: ED Triage Vitals  Enc Vitals Group     BP 10/29/17 2028 131/79     Pulse Rate 10/29/17 2028 75     Resp 10/29/17 2028 18     Temp 10/29/17 2028 98.2 F (36.8 C)     Temp Source 10/29/17 2028 Oral     SpO2 10/29/17 2028 100 %     Weight 10/29/17 2029 72.6 kg (160 lb)     Height 10/29/17 2029 1.626 m (5\' 4" )     Head Circumference --      Peak Flow --      Pain Score 10/29/17 2032 8     Pain Loc --      Pain Edu? --      Excl. in GC? --     Constitutional: Alert and oriented. Well appearing and in no acute distress. Eyes: Conjunctivae are normal. PERRL. EOMI. Head: Atraumatic. Ears:  Healthy appearing ear canals and TMs bilaterally Mouth/Throat: Mucous membranes are moist.  Oropharynx non-erythematous. Neck: No stridor.   Cardiovascular: Normal rate,  regular rhythm. Good peripheral circulation. Grossly normal heart sounds. Respiratory: Normal respiratory effort.  No retractions. Lungs CTAB. Gastrointestinal: Soft and nontender. No distention.  Musculoskeletal: No lower extremity tenderness nor edema. No gross deformities of extremities. Neurologic:  Normal speech and language. No gross focal neurologic deficits are appreciated.  Skin:  Skin is warm, dry and intact. No rash noted. Psychiatric: Mood and affect are normal. Speech and behavior are normal.  ____________________________________________   LABS (all  labs ordered are listed, but only abnormal results are displayed)  Labs Reviewed  COMPREHENSIVE METABOLIC PANEL - Abnormal; Notable for the following components:      Result Value   Glucose, Bld 100 (*)    BUN 21 (*)    All other components within normal limits  CBC WITH DIFFERENTIAL/PLATELET     RADIOLOGY I, [place] N Antwan Pandya, personally viewed and evaluated these images (plain radiographs) as part of my medical decision making, as well as reviewing the written report by the radiologist.  ED MD interpretation: No acute intracranial abnormality on CT head per radiologist.  Official radiology report(s): Ct Head Wo Contrast  Result Date: 10/29/2017 CLINICAL DATA:  Headache and right otalgia. EXAM: CT HEAD WITHOUT CONTRAST TECHNIQUE: Contiguous axial images were obtained from the base of the skull through the vertex without intravenous contrast. COMPARISON:  03/01/2017 FINDINGS: Brain: No evidence of acute infarction, hemorrhage, hydrocephalus, extra-axial collection or mass lesion/mass effect. Vascular: No hyperdense vessel or unexpected calcification. Skull: Normal. Negative for fracture or focal lesion. Sinuses/Orbits: No acute finding. Other: None. IMPRESSION: No acute intracranial abnormality. Electronically Signed   By: Ted Mcalpine M.D.   On: 10/29/2017 20:56      Procedures   ____________________________________________   INITIAL IMPRESSION / ASSESSMENT AND PLAN / ED COURSE  As part of my medical decision making, I reviewed the following data within the electronic MEDICAL RECORD NUMBER   47 year old male presenting with above-stated history and physical exam secondary to headache.  Patient given IV Toradol Reglan and Benadryl with complete resolution of headache.  CT scan of the head was performed which revealed no acute intracranial abnormality per the radiologist. ____________________________________________  FINAL CLINICAL IMPRESSION(S) / ED DIAGNOSES  Final  diagnoses:  Acute nonintractable headache, unspecified headache type     MEDICATIONS GIVEN DURING THIS VISIT:  Medications  ketorolac (TORADOL) 30 MG/ML injection 30 mg (has no administration in time range)  metoCLOPramide (REGLAN) injection 10 mg (has no administration in time range)  sodium chloride 0.9 % bolus 1,000 mL (has no administration in time range)  diphenhydrAMINE (BENADRYL) injection 25 mg (has no administration in time range)     ED Discharge Orders    None       Note:  This document was prepared using Dragon voice recognition software and may include unintentional dictation errors.    Darci Current, MD 10/30/17 [phone]

## 2018-03-31 IMAGING — CT CT HEAD W/O CM
3 series · 16 of 46 positions shown, 19 images · non-contrast
Comparison: 01/27/2017

CLINICAL DATA: Patient was found in the grass. Casted foot with
pain. Alcohol use.

EXAM:
CT HEAD WITHOUT CONTRAST
TECHNIQUE: Contiguous axial images were obtained from the base of the skull
through the vertex without intravenous contrast.

[Series 3: head wo · axial · 0.41mm/px · z∈[-56,+64]mm · 10 of 29 slices shown, 13 images]
[im 3/29  brain]
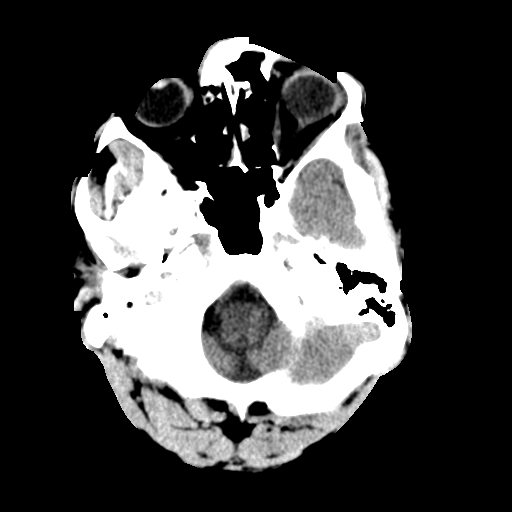
[im 3/29  bone]
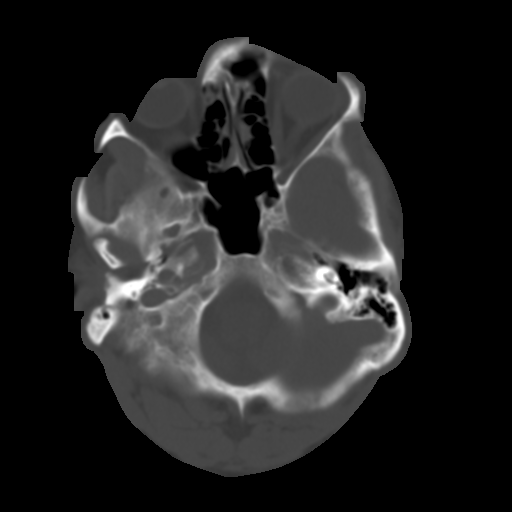
[im 6/29  brain]
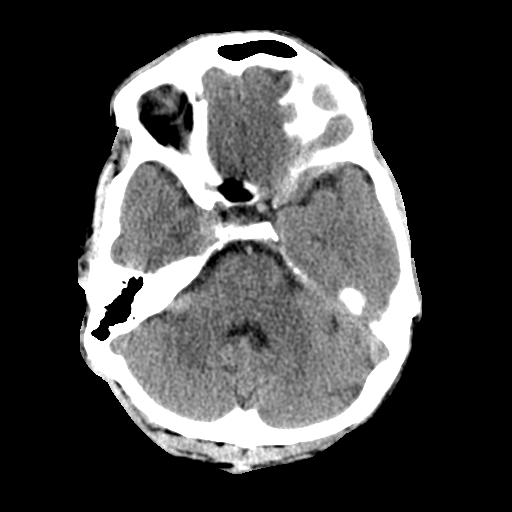
[im 8/29  brain]
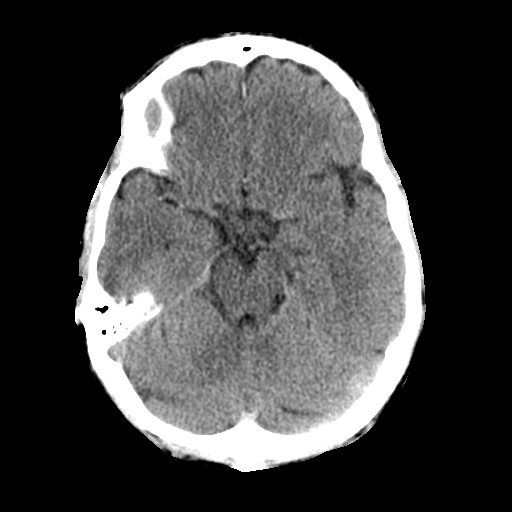
[im 11/29  brain]
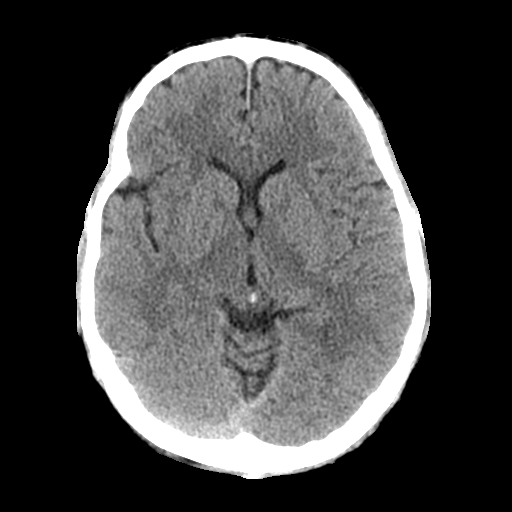
[im 14/29  brain]
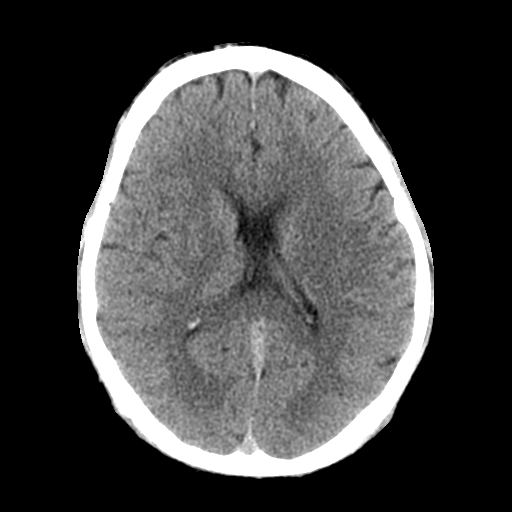
[im 14/29  bone]
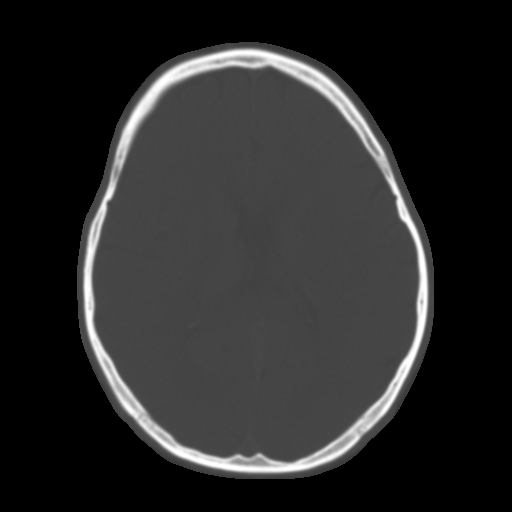
[im 16/29  brain]
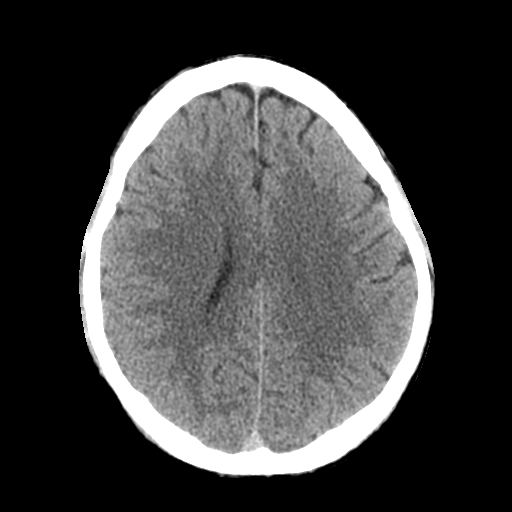
[im 19/29  brain]
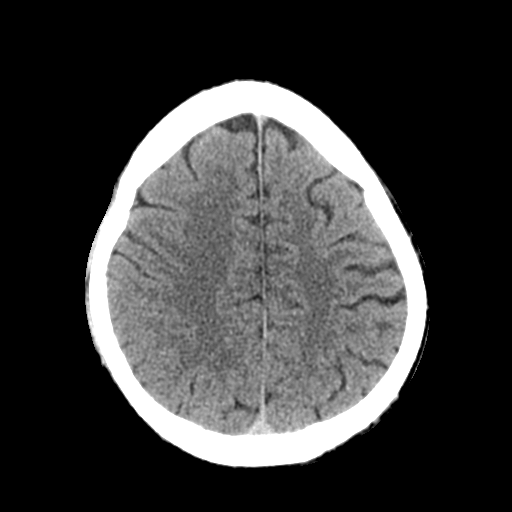
[im 22/29  brain]
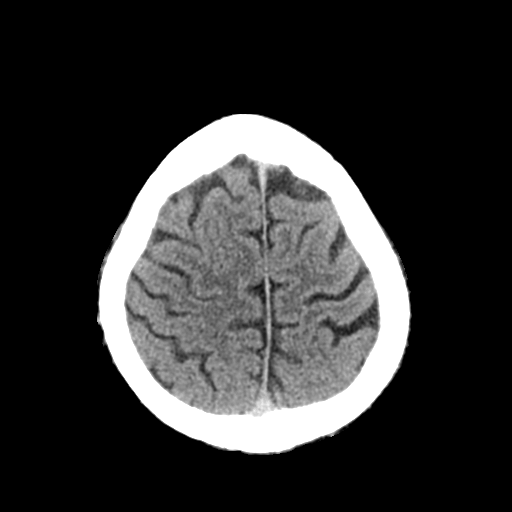
[im 24/29  brain]
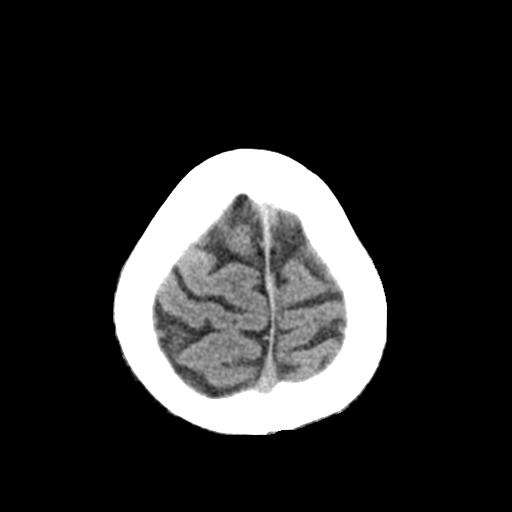
[im 24/29  bone]
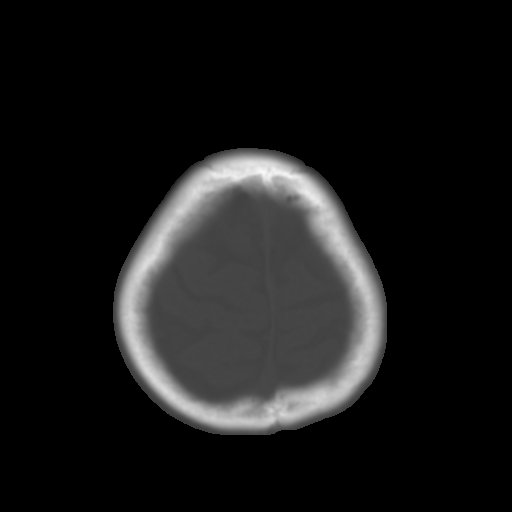
[im 27/29  brain]
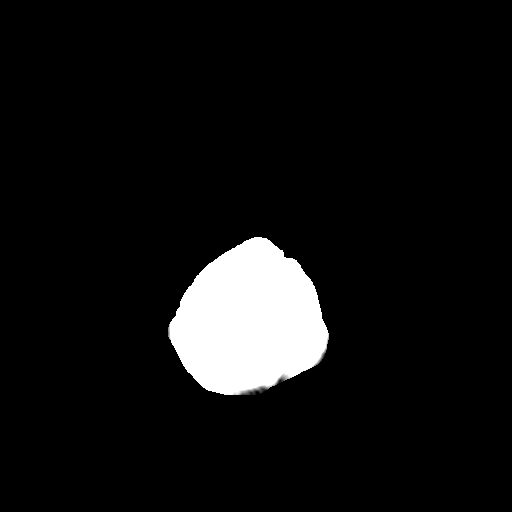

[Series 4: coronal soft tissue · coronal · 0.29mm/px · 3 of 64 slices shown]
[im 22/64  brain]
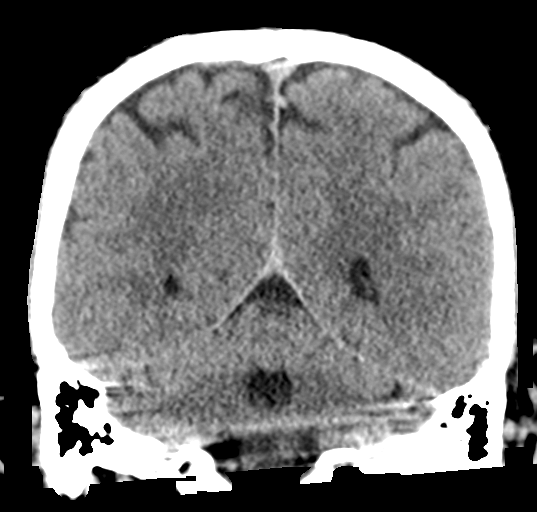
[im 29/64  brain]
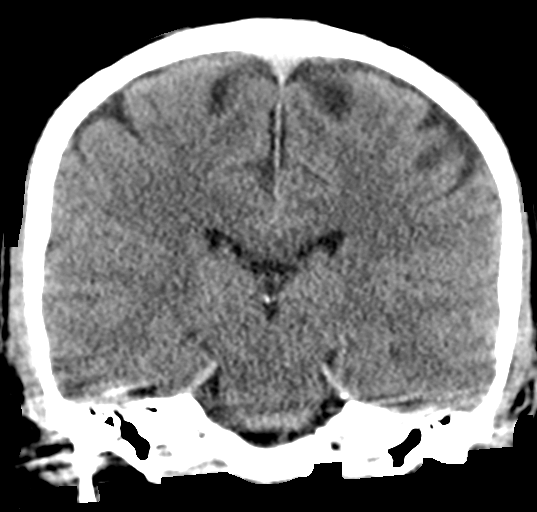
[im 36/64  brain]
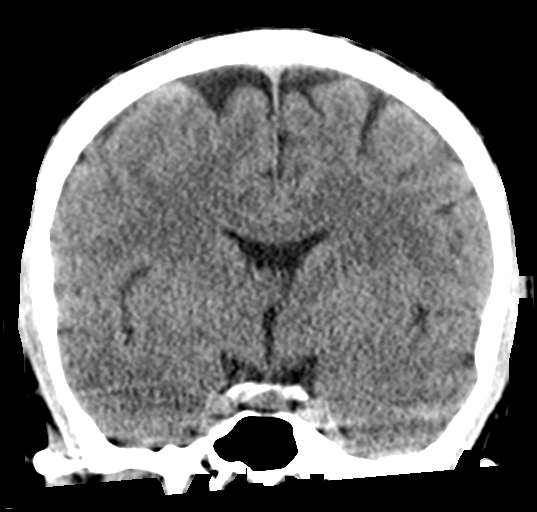

[Series 5: sagittal soft tissue · sagittal · 0.29mm/px · 3 of 52 slices shown]
[im 18/52  brain]
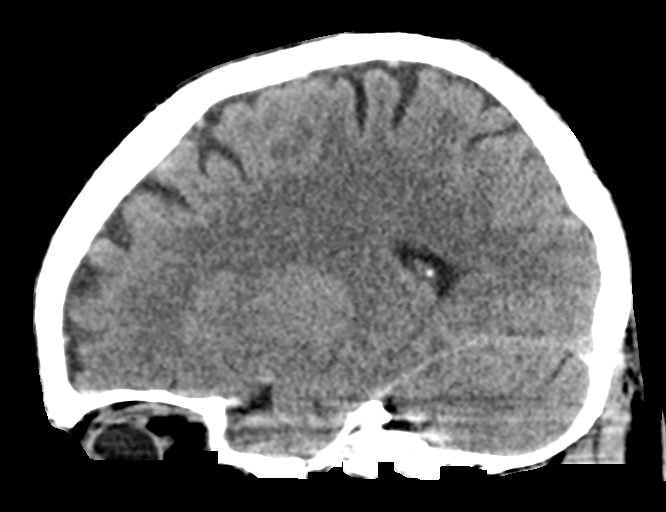
[im 26/52  brain]
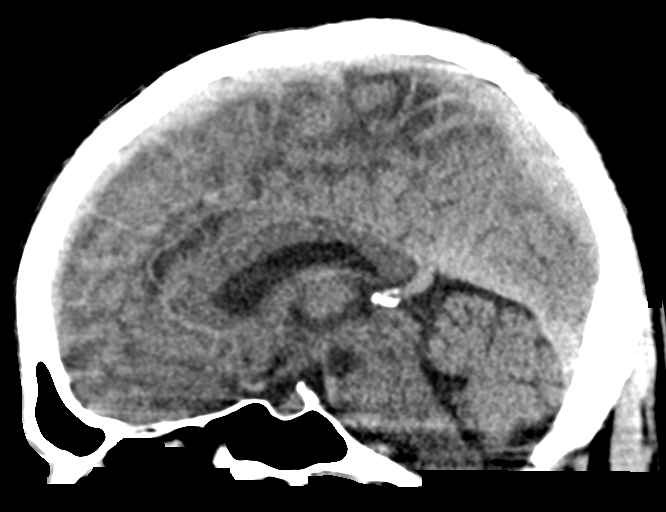
[im 35/52  brain]
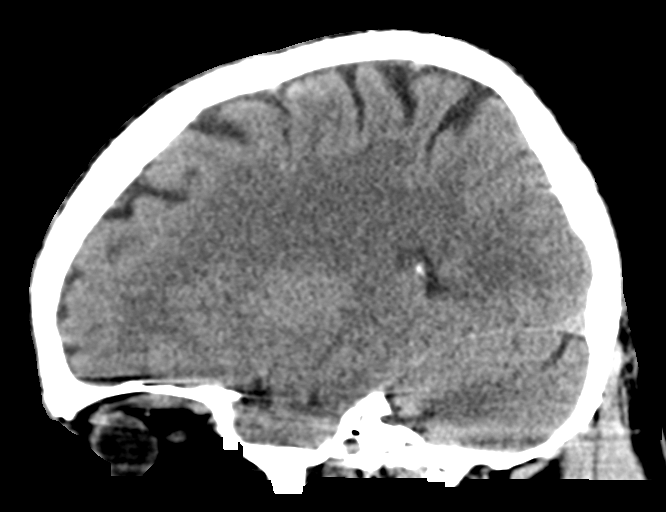

[16 of 46 positions shown; findings below may reference images not displayed]

FINDINGS: Brain: Ventricles and sulci appear symmetrical. No mass effect or
midline shift. No abnormal extra-axial fluid collections. Gray-white
matter junctions are distinct. Basal cisterns are not effaced. No
acute intracranial hemorrhage.

Vascular: No hyperdense vessel or unexpected calcification.

Skull: Calvarium appears intact.

Sinuses/Orbits: Opacification of some of the ethmoid air cells. No
acute air-fluid levels in the paranasal sinuses. Mastoid air cells
are not opacified.

Other: None.
IMPRESSION: No acute intracranial abnormalities.
